# Patient Record
Sex: Male | Born: 1960
Health system: Southern US, Community
[De-identification: ages and names within clinical notes are randomized; demographics above are authoritative.]

## PROBLEM LIST (undated history)

## (undated) DIAGNOSIS — I493 Ventricular premature depolarization: Secondary | ICD-10-CM

## (undated) DIAGNOSIS — C4491 Basal cell carcinoma of skin, unspecified: Secondary | ICD-10-CM

## (undated) DIAGNOSIS — K311 Adult hypertrophic pyloric stenosis: Secondary | ICD-10-CM

## (undated) DIAGNOSIS — K219 Gastro-esophageal reflux disease without esophagitis: Secondary | ICD-10-CM

## (undated) DIAGNOSIS — I480 Paroxysmal atrial fibrillation: Secondary | ICD-10-CM

## (undated) DIAGNOSIS — I4891 Unspecified atrial fibrillation: Secondary | ICD-10-CM

## (undated) DIAGNOSIS — G43109 Migraine with aura, not intractable, without status migrainosus: Secondary | ICD-10-CM

## (undated) DIAGNOSIS — E785 Hyperlipidemia, unspecified: Secondary | ICD-10-CM

## (undated) DIAGNOSIS — E669 Obesity, unspecified: Secondary | ICD-10-CM

## (undated) DIAGNOSIS — F419 Anxiety disorder, unspecified: Secondary | ICD-10-CM

## (undated) HISTORY — DX: Adult hypertrophic pyloric stenosis: K31.1

## (undated) HISTORY — DX: Gastro-esophageal reflux disease without esophagitis: K21.9

## (undated) HISTORY — DX: Anxiety disorder, unspecified: F41.9

## (undated) HISTORY — DX: Ventricular premature depolarization: I49.3

## (undated) HISTORY — DX: Obesity, unspecified: E66.9

## (undated) HISTORY — DX: Paroxysmal atrial fibrillation: I48.0

## (undated) HISTORY — DX: Migraine with aura, not intractable, without status migrainosus: G43.109

## (undated) HISTORY — DX: Basal cell carcinoma of skin, unspecified: C44.91

## (undated) HISTORY — PX: HERNIA REPAIR: SHX51

## (undated) HISTORY — DX: Hyperlipidemia, unspecified: E78.5

## (undated) HISTORY — DX: Unspecified atrial fibrillation: I48.91

## (undated) HISTORY — PX: OTHER SURGICAL HISTORY: SHX169

---

## 1998-02-22 DIAGNOSIS — I4891 Unspecified atrial fibrillation: Secondary | ICD-10-CM

## 1998-02-22 HISTORY — DX: Unspecified atrial fibrillation: I48.91

## 2003-06-23 ENCOUNTER — Observation Stay (HOSPITAL_COMMUNITY): Admission: EM | Admit: 2003-06-23 | Discharge: 2003-06-24 | Payer: Self-pay

## 2005-07-10 IMAGING — CR DG CHEST 1V PORT
1 series · 1 of 1 positions shown · non-contrast
Comparison: none

CLINICAL DATA: Irregular heart rate.
 PORTABLE SINGLE-VIEW CHEST, 06/23/03, [DATE] HOURS
 No prior studies for comparison.
 No evidence of edema, infiltrate, or pleural effusion.  Heart size upper limits normal.  There likely is a hiatal hernia.  Bony thorax is unremarkable.
 IMPRESSION 
 No active disease.  Mild prominence of heart size and probable underlying hiatal hernia.

[view not recorded]
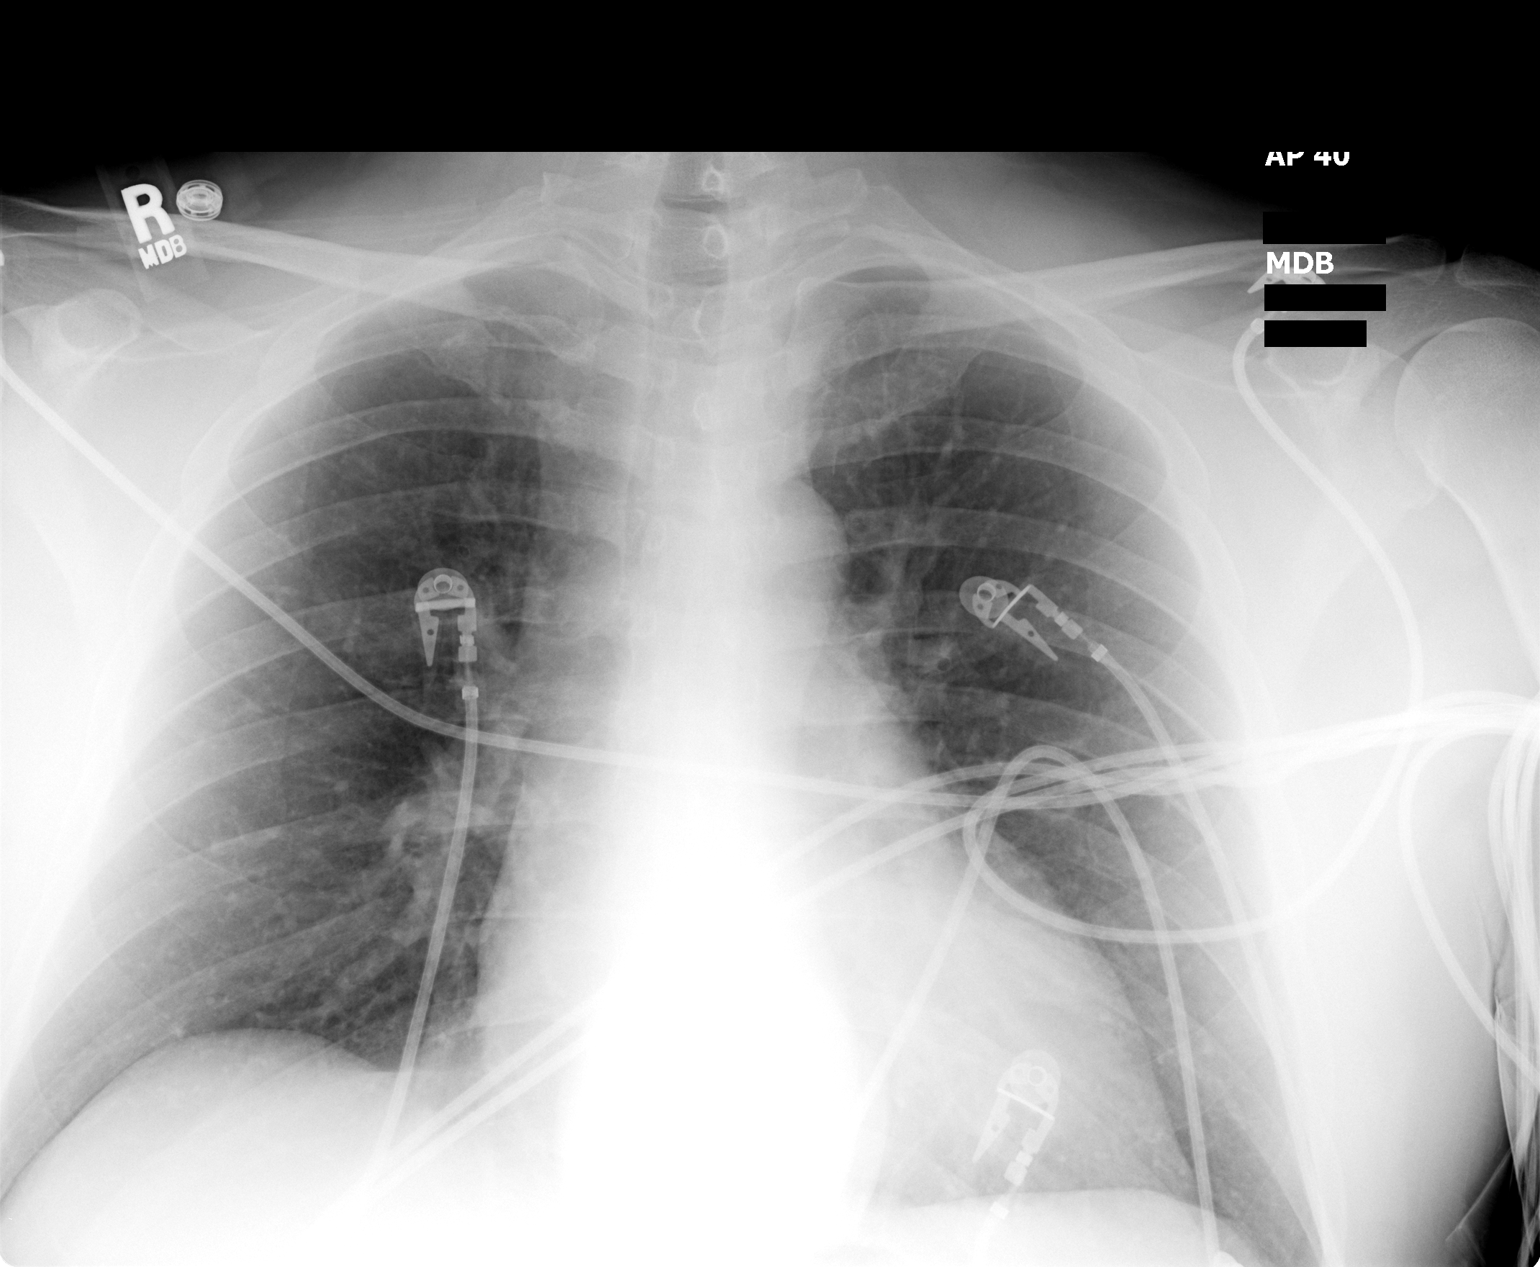

[1 of 1 positions shown; findings below may reference images not displayed]

## 2009-03-10 ENCOUNTER — Emergency Department (HOSPITAL_COMMUNITY): Admission: EM | Admit: 2009-03-10 | Discharge: 2009-03-10 | Payer: Self-pay | Admitting: Emergency Medicine

## 2010-07-10 NOTE — Discharge Summary (Signed)
Eddie Harris, Eddie Harris                           ACCOUNT NO.:  [id number]   MEDICAL RECORD NO.:  [id number]                   PATIENT TYPE:  INP   LOCATION:  3738                                 FACILITY:  [facility]   PHYSICIAN:  Colleen Can. Deborah Chalk, M.D.            DATE OF BIRTH:  [birth year]   DATE OF ADMISSION:  06/23/2003  DATE OF DISCHARGE:                                 DISCHARGE SUMMARY   PRIMARY DISCHARGE DIAGNOSIS:  New onset atrial fibrillation with subsequent  resolution and conversion back to normal sinus rhythm.   HISTORY OF PRESENT ILLNESS:  Mr. Leffel is a very pleasant, 50 year old white  male who basically has known cardiac history.  He has had isolated PVCs in  the past, but overall has done well from a cardiac standpoint.  He presents  to the emergency room department on the morning of 06/23/2003 for complaints  of palpitations.  He had been to a party on the evening before had had  significant alcohol use as well as had overeaten.  When he awoke on the  morning of admission he got nauseated.  He subsequently vomited x1 and then  he began to notice increase in his heart rate.  He really had no complaints  of chest pain, no real lightheadedness or dizziness and no frank syncope.  When he presented to the emergency room he was noted to have atrial  fibrillation with a rapid ventricular response.  Heart rate was  approximately 150. He was subsequently admitted for further evaluation.   Please see dictated history and physical for the patient presentation and  profile.   LABORATORY DATA:  EKG showed atrial fibrillation with rapid ventricular  response; there were nonspecific changes.   Cardiac enzymes were negative. TSH was normal. CBC showed a white count of  12.9, hematocrit 45, hemoglobin 16.  Potassium was 3.6.   Chest x-ray showed a likely hiatal hernia with no active disease.  There was  mild prominence of the heart size.  It was a portable film.   HOSPITAL COURSE:   The patient was admitted. He was placed on IV Cardizem as  well as oral dose or Lopressor and began on subcutaneous Lovenox.  He  subsequently converted, spontaneously, to normal sinus rhythm.   On 06/24/2003 he is doing well without complaints.  His enzymes are all  negative for cardiac injury and he is felt to be a stable candidate for  discharge with further evaluation to occur on outpatient basis.   DISCHARGE CONDITION:  Stable.   DISCHARGE MEDICATIONS:  1. Aspirin daily.  2. Toprol XL 50 mg daily.   DISCHARGE INSTRUCTIONS:  1. Activity is to be progressive.  2. Diet is heart healthy with limited alcohol use.  3. Our office will be arranging follow up time for a 2D echocardiogram as     well as stress Cardiolite.  4. He is to call if any  problems arise in the interim.      Sharlee Blew, N.P.                     Colleen Can. Deborah Chalk, M.D.    LC/MEDQ  D:  06/24/2003  T:  06/24/2003  Job:  [id number]   cc:   Meredith Staggers, M.D.  [address]  [place]  [place] [address]  Fax: [phone]

## 2010-07-10 NOTE — H&P (Signed)
NAMEANTONE, SUMMONS                           ACCOUNT NO.:  [id number]   MEDICAL RECORD NO.:  [id number]                   PATIENT TYPE:  EMS   LOCATION:  MAJO                                 FACILITY:  [facility]   PHYSICIAN:  Colleen Can. Deborah Chalk, M.D.            DATE OF BIRTH:  [birth year]   DATE OF ADMISSION:  06/23/2003  DATE OF DISCHARGE:                                HISTORY & PHYSICAL   CHIEF COMPLAINT:  I feel like my heart is humming.   HISTORY OF PRESENT ILLNESS:  Mr. Krider is a very-pleasant 50 year old male  who presented to the emergency department this morning with atrial  fibrillation with a rapid ventricular response.  He basically had been in  his usual state of health.  However, he attended a birthday party last  evening.  He did have significant alcohol use and over-ate.  This morning,  when he awoke, he felt nauseated.  He subsequently vomited x1 and then he  noted an increase in his heart rate.  He had no chest pain, no real  lightheadedness or dizziness, and no syncope.  He presented to the  extremities for evaluation and was noted to have atrial fibrillation with a  rapid ventricular response.  He is currently on IV Cardizem and has received  IV Lopressor.   PAST MEDICAL HISTORY:  1. Isolated PVC's.  2. History of pyloric stenosis, repaired at age 67.  3. There are no other reports of hypertension, no other surgeries.   ALLERGIES:  SHELLFISH wish cause anaphylactic reaction.   MEDICATIONS:  None except occasional Flonase.   FAMILY HISTORY:  Unknown.  The patient is adopted.   SOCIAL HISTORY:  He is married.  His wife's name is Misty Stanley.  He has been  married for 14 years.  He has two children.  He does have rare cigar use and  basically he has had just social alcohol use in the past.  He is employed in  [employer].   REVIEW OF SYSTEMS:  As noted above, and is otherwise unremarkable.  He has  had no recent fever or flu.  No abdominal pain, no  constipation, no  diarrhea, no chest pain, no syncope or presyncope.   PHYSICAL EXAMINATION:  GENERAL:  He is currently in no acute distress.  VITAL SIGNS:  Blood pressure 106/76, heart rate is in the 160's slowing to  the 120's.  Respirations 18.  He is afebrile.  SKIN:  Warm and dry.  Color is unremarkable.  LUNGS:  Fairly clear.  HEART:  Shows a tachycardic and irregular rhythm.  ABDOMEN:  Soft, positive bowel sounds, nontender.  EXTREMITIES:  Without edema.  NEUROLOGIC:  Intact.   PERTINENT LABORATORY DATA:  CBC shows white count of 12.9.  Chemistries are  pending.  CK is negative.  EKG shows atrial fibrillation with rapid  ventricular response.  There are nonspecific changes.  Chest x-ray is  pending.   OVERALL IMPRESSION:  Atrial fibrillation with rapid ventricular response,  most likely secondary to alcohol use.   PLAN:  1. He is placed on IV Cardizem.  2. Oral Lopressor.  3. We will check complete labs.  4. We will also place him on subcutaneous Lovenox.      Sharlee Blew, N.P.                     Colleen Can. Deborah Chalk, M.D.    LC/MEDQ  D:  06/23/2003  T:  06/23/2003  Job:  [id number]   cc:   Meredith Staggers, M.D.  [address]  [place]  [place] [address]  Fax: [phone]

## 2010-07-29 ENCOUNTER — Ambulatory Visit (INDEPENDENT_AMBULATORY_CARE_PROVIDER_SITE_OTHER): Payer: BC Managed Care – PPO | Admitting: Family Medicine

## 2010-07-29 ENCOUNTER — Encounter: Payer: Self-pay | Admitting: Family Medicine

## 2010-07-29 VITALS — BP 134/94 | HR 72 | Ht 70.0 in | Wt 229.0 lb

## 2010-07-29 DIAGNOSIS — Z125 Encounter for screening for malignant neoplasm of prostate: Secondary | ICD-10-CM

## 2010-07-29 DIAGNOSIS — F411 Generalized anxiety disorder: Secondary | ICD-10-CM | POA: Insufficient documentation

## 2010-07-29 DIAGNOSIS — E782 Mixed hyperlipidemia: Secondary | ICD-10-CM

## 2010-07-29 MED ORDER — ESCITALOPRAM OXALATE 10 MG PO TABS: 10.0000 mg | ORAL_TABLET | Freq: Every day | ORAL | Status: DC

## 2010-07-29 NOTE — Patient Instructions (Signed)
Limit salt in diet; Exercise daily; Cut back on alcohol to 1-2 drinks daily maximum (less recommended to decrease calories to help with weight loss) Continue Fish oil.  Don't start the red yeast rice until after labs are done Check blood pressure elsewhere, write down and mail/fax/bring to next appointment

## 2010-07-29 NOTE — Progress Notes (Signed)
Subjective:    Patient ID: Eddie Harris, male    DOB: [birth year], 50 y.o.   MRN: [id number]  HPI Patient presents to discuss his Lexapro.  It was started at least 5 years ago after his wife had an affair, and he was having panic attacks.  Denies h/o depression.  He has a lot of work stress, but denies any panic attacks or significant anxiety (just occasional anxiety)  2000 went to a party and drank too much.  The next morning he had atrial fibrillation, and was hospitalized.  He had a negative cardiac workup.  Developed anxiety and panic attacks from that time forward.  He was treated with Toprol.  Has been off the Toprol for many years, and has done well without recurrent symptoms, just occasional PVC's.  Patient has high cholesterol (recalls the LDL and TG being elevated).  TG improved with fish oil, also had used red yeast rice.  He recalls that I had asked for him to return for PSA, hs-CRP and repeat lipids and liver tests (because of the red yeast rice), but he never came back for those.  Last lipid check was over a year ago, per patient.  Past Medical History  Diagnosis Date  . Atrial fibrillation 2000    negative cardiac workup (Dr. Maylon Cos).  Episode occurred after heavy drinking  . Hyperlipidemia   . Anxiety     Past Surgical History  Procedure Date  . Hernia repair age 72 weeks    ?diaphragmatic given age    History   Social History  . Marital Status: Married    Spouse Name: N/A    Number of Children: N/A  . Years of Education: N/A   Occupational History  . 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 Smoking status: Former Smoker    Quit date: 02/23/1987  . Smokeless tobacco: Never Used  . Alcohol Use: Yes     2-3 drinks every other day or so.  . Drug Use: No  . Sexually Active: Not on file   Other Topics Concern  . Not on file   Social History Narrative  . No narrative on file    Family History  Problem Relation Age of Onset  .  Adopted: Yes    Current outpatient prescriptions:arginine 500 MG tablet, Take 500 mg by mouth daily.  , Disp: , Rfl: ;  CALCIUM & MAGNESIUM CARBONATES PO, Take 1 tablet by mouth daily.  , Disp: , Rfl: ;  escitalopram (LEXAPRO) 10 MG tablet, Take 1 tablet (10 mg total) by mouth daily., Disp: 30 tablet, Rfl: 11;  fish oil-omega-3 fatty acids 1000 MG capsule, Take 4 g by mouth daily.  , Disp: , Rfl:  vitamin C (ASCORBIC ACID) 500 MG tablet, Take 500 mg by mouth daily.  , Disp: , Rfl: ;  DISCONTD: escitalopram (LEXAPRO) 10 MG tablet, Take 10 mg by mouth daily.  , Disp: , Rfl:   Allergies  Allergen Reactions  . Shellfish-Derived Products Anaphylaxis and Nausea And Vomiting   Review of Systems Occasional numbness R fingers (1-2), denies weakness.  Denies headaches, allergies, cough, SOB, swelling, joint pain (occasional neck discomfort/stiffness).  Denies reflux, nausea/vomiting, changes in stool (often has a loose stool after his coffee).  No skin rashes, depression, or other concerns    Objective:   Physical Exam  Well developed, well nourished patient, in no distress BP 134/94  Pulse 72  Ht 5\' 10"  (1.778 m)  Wt  229 lb (103.874 kg)  BMI 32.86 kg/m2 Neck: No lymphadenopathy or thyromegaly, no carotid bruit Heart:  Regular rate and rhythm, no murmurs, rubs, gallops or ectopy Lungs:  Clear bilaterally, without wheezes, rales or ronchi Abdomen:  Soft, nontender, nondistended, no hepatosplenomegaly or masses, normal bowel sounds Extremities:  No clubbing, cyanosis or edema, 2+ pulses.  Neuro:  Alert and oriented x 3, cranial nerves grossly intact.  Back:  No spine or CVA tenderness Skin: no rashes or suspicious lesions Psych:  Normal mood, affect, hygiene and grooming, normal speech, eye contact     Assessment & Plan:   1. Anxiety state, unspecified     Elects to continue with Lexapro, but will try tapering off.  If he needs to go back on Lexapro 10mg  (and not come off), then can consider  change to citalopram  2. Mixed hyperlipidemia  Comprehensive metabolic panel, Lipid panel, CRP High sensitivity   lowfat, low cholesterol diet. Continue fish oil.  Cut back on alcohol intake  3. Special screening for malignant neoplasm of prostate  PSA   #1--If he ends up switching to Celexa, and doesn't end up doing as well on Celexa as he did on the Lexapro, but needs to remain on medication (fails taper), then we can Rx 20mg  tablet and have him cut in half to cut costs.  Borderline/elevated blood pressure today.  No history of HTN.  Discussed low sodium diet, regular exercise, weight loss.  Recommended that he periodically check BP's elsewhere.  F/U for fasting lab visit next week.

## 2010-08-06 ENCOUNTER — Encounter: Payer: Self-pay | Admitting: Family Medicine

## 2011-02-24 ENCOUNTER — Encounter: Payer: Self-pay | Admitting: Medical

## 2011-02-24 ENCOUNTER — Ambulatory Visit (INDEPENDENT_AMBULATORY_CARE_PROVIDER_SITE_OTHER): Payer: BC Managed Care – PPO | Admitting: Medical

## 2011-02-24 VITALS — BP 140/80 | HR 80 | Temp 98.2°F | Resp 16 | Wt 234.0 lb

## 2011-02-24 DIAGNOSIS — R030 Elevated blood-pressure reading, without diagnosis of hypertension: Secondary | ICD-10-CM | POA: Insufficient documentation

## 2011-02-24 DIAGNOSIS — W19XXXA Unspecified fall, initial encounter: Secondary | ICD-10-CM

## 2011-02-24 DIAGNOSIS — E663 Overweight: Secondary | ICD-10-CM

## 2011-02-24 DIAGNOSIS — S0093XA Contusion of unspecified part of head, initial encounter: Secondary | ICD-10-CM | POA: Insufficient documentation

## 2011-02-24 DIAGNOSIS — S0003XA Contusion of scalp, initial encounter: Secondary | ICD-10-CM

## 2011-02-24 HISTORY — DX: Elevated blood-pressure reading, without diagnosis of hypertension: R03.0

## 2011-02-24 NOTE — Progress Notes (Signed)
Subjective:   HPI  Eddie Harris is a 51 y.o. male who presents with multiple concerns.  He was last seen here back in the summer with Dr. Lynelle Doctor, was supposed to have return for fasting labs but hasn't.  His main c/o is that he had a few drinks new year's eve, but says he wasn't inebriated.  He ended up walking backwards and tripped over his feet.  He landed on his butt bone and hit the back of his head.  He denies LOC, headache, confusion, numbness, tingling, weakness, dizziness, no bleeding.  He still notes tenderness at the area of his head that hit, but wants to get checked out in the event of other concerns. He notes only having 3-4 drinks that night.  He is here for recheck on elevated BP.  Had elevated BP in the summer.  However, he has just started the modified [employer] and plans to lose weight through diet and exercise changes.  He is tired of his current weight.   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  No other aggravating or relieving factors.    No other c/o.  The following portions of the patient's history were reviewed and updated as appropriate: allergies, current medications, past family history, past medical history, past social history, past surgical history and problem list.  Past Medical History  Diagnosis Date  . Campath-induced atrial fibrillation 2000    negative cardiac workup (Dr. Maylon Cos).  Episode occurred after heavy drinking  . Hyperlipidemia   . Anxiety   . GERD (gastroesophageal reflux disease)     silent--EGD Dr. Kinnie Scales  . Ocular migraine     Dr. Salvatore Marvel  . Skin cancer, basal cell     Dr. Doristine Section    Allergies  Allergen Reactions  . Shellfish-Derived Products Anaphylaxis and Nausea And Vomiting    Current Outpatient Prescriptions on File Prior to Visit  Medication Sig Dispense Refill  . arginine 500 MG tablet Take 500 mg by mouth daily.        Marland Kitchen CALCIUM & MAGNESIUM CARBONATES PO Take 1 tablet by  mouth daily.        . fish oil-omega-3 fatty acids 1000 MG capsule Take 4 g by mouth daily.        . vitamin C (ASCORBIC ACID) 500 MG tablet Take 500 mg by mouth daily.           Review of Systems Constitutional: denies fever, chills, sweats, unexpected weight change, fatigue ENT: no runny nose, ear pain, sore throat Cardiology: denies chest pain, palpitations, edema Respiratory: denies cough, shortness of breath, wheezing Gastroenterology: denies abdominal pain, nausea, vomiting, diarrhea, constipation  Hematology: denies bleeding or bruising problems Musculoskeletal: denies arthralgias, myalgias, joint swelling, back pain Ophthalmology: denies vision changes Urology: denies dysuria, difficulty urinating, hematuria, urinary frequency, urgency Neurology: no headache, weakness, tingling, numbness      Objective:   Physical Exam  General appearance: alert, no distress, WD/WN, white male, overweight HEENT: normocephalic, posterior scalp with tender slight raised area consistent with hematoma, otherwise sclerae anicteric, TMs pearly, canals normal, nares patent, no discharge or erythema, pharynx normal, no battle signs Oral cavity: MMM, no lesions Neck: supple, no lymphadenopathy, no thyromegaly, no masses, no bruits Heart: RRR, normal S1, S2, no murmurs Lungs: CTA bilaterally, no wheezes, rhonchi, or rales Abdomen: +bs, soft, non tender, non distended, no masses, no hepatomegaly, no splenomegaly Pulses: 2+ symmetric, upper and lower extremities, normal cap  refill Neuro: CN 2-12 intact, A&O x 3, non focal exam  Assessment and Plan :     Encounter Diagnoses  Name Primary?  . Head contusion Yes  . Fall   . Elevated blood pressure reading without diagnosis of hypertension   . Overweight    Head contusion-reassured that current exam suggest a contusion but no concussion or intracranial bleed.  If symptoms worsen with signs of acute neurological changes, consider CT head urgently.  However, no real indication for that today.  Fall-advise he avoid falls, discussed fall prevention, discussed alcohol use  Elevated Blood pressure-he is working on lifestyle changes, but he will return for fasting labs as discussed with Dr. Lynelle Doctor in the summer.  Pending labs may need recheck office visit to discuss labs and plan which may include beginning medication for blood pressure.  Labs: CMET, Lipid, PSA, CRP.  Obesity -  discussed need to work on diet and exercise changes for weight loss

## 2011-02-24 NOTE — Patient Instructions (Signed)
Follow up for fasting labs.

## 2011-05-03 ENCOUNTER — Encounter: Payer: Self-pay | Admitting: *Deleted

## 2011-05-14 ENCOUNTER — Other Ambulatory Visit (INDEPENDENT_AMBULATORY_CARE_PROVIDER_SITE_OTHER): Payer: BC Managed Care – PPO

## 2011-05-14 DIAGNOSIS — Z111 Encounter for screening for respiratory tuberculosis: Secondary | ICD-10-CM

## 2011-05-14 DIAGNOSIS — Z23 Encounter for immunization: Secondary | ICD-10-CM

## 2011-05-14 MED ORDER — TUBERCULIN PPD 5 UNIT/0.1ML ID SOLN
5.0000 [IU] | Freq: Once | INTRADERMAL | Status: AC
  Administered 2011-05-14: 5 [IU] via INTRADERMAL

## 2011-08-05 ENCOUNTER — Other Ambulatory Visit: Payer: Self-pay | Admitting: *Deleted

## 2011-08-05 DIAGNOSIS — Z125 Encounter for screening for malignant neoplasm of prostate: Secondary | ICD-10-CM

## 2011-08-05 DIAGNOSIS — E782 Mixed hyperlipidemia: Secondary | ICD-10-CM

## 2011-08-09 ENCOUNTER — Encounter: Payer: Self-pay | Admitting: Internal Medicine

## 2011-08-13 ENCOUNTER — Other Ambulatory Visit: Payer: BC Managed Care – PPO

## 2011-08-20 ENCOUNTER — Other Ambulatory Visit: Payer: BC Managed Care – PPO

## 2011-08-20 DIAGNOSIS — Z125 Encounter for screening for malignant neoplasm of prostate: Secondary | ICD-10-CM

## 2011-08-20 DIAGNOSIS — E782 Mixed hyperlipidemia: Secondary | ICD-10-CM

## 2011-08-20 LAB — COMPREHENSIVE METABOLIC PANEL
ALT: 25 U/L (ref 0–53)
AST: 24 U/L (ref 0–37)
Albumin: 4.6 g/dL (ref 3.5–5.2)
BUN: 19 mg/dL (ref 6–23)
CO2: 25 mEq/L (ref 19–32)
Chloride: 106 mEq/L (ref 96–112)
Creat: 1.13 mg/dL (ref 0.50–1.35)
Glucose, Bld: 104 mg/dL — ABNORMAL HIGH (ref 70–99)
Sodium: 141 mEq/L (ref 135–145)

## 2011-08-20 LAB — LIPID PANEL
HDL: 51 mg/dL (ref >39)
LDL Cholesterol: 148 mg/dL — ABNORMAL HIGH (ref 0–99)
Total CHOL/HDL Ratio: 4.4 Ratio

## 2011-08-20 LAB — HIGH SENSITIVITY CRP: CRP, High Sensitivity: 1 mg/L

## 2011-08-23 ENCOUNTER — Telehealth: Payer: Self-pay | Admitting: Family Medicine

## 2011-08-23 NOTE — Telephone Encounter (Signed)
Spoke with patient and went over labs. He scheduled CPE for 10/27/11 @ 10:15am.

## 2011-08-23 NOTE — Telephone Encounter (Signed)
Patient called and wanted labs results from 08/20/11.

## 2011-08-23 NOTE — Telephone Encounter (Signed)
Cholesterol and sugar were slightly high.  These labs were ordered a year ago.  Due for another CPE.  Can discuss labs in more detail at his visit. In the interim, low cholesterol diet, avoid sugars/sweets (sodas, sweet tea, alcohol, carbs can all contribute to sugars).

## 2011-09-14 ENCOUNTER — Telehealth: Payer: Self-pay | Admitting: Family Medicine

## 2011-09-14 NOTE — Telephone Encounter (Signed)
Veronica--please call and see what his question is.  Office visits (as opposed to PE's) are specifically for this reason.  If he wants to restart, and needs to know how, start at 1/2 tablet for a week, then increase to full tablet.  His refills likely are expired, as rx was written over a year ago

## 2011-09-14 NOTE — Telephone Encounter (Signed)
Patient hasn't been seen by me in over a year.  Please have him schedule an appt.  Looks he has a CPE scheduled for September, but can schedule an OV sooner if having a specific issue (ie f/u anxiety, or whatever his concern is).

## 2011-09-15 ENCOUNTER — Telehealth: Payer: Self-pay | Admitting: *Deleted

## 2011-09-15 ENCOUNTER — Telehealth: Payer: Self-pay | Admitting: Family Medicine

## 2011-09-15 NOTE — Telephone Encounter (Signed)
Left message for patient to return my call.

## 2011-09-15 NOTE — Telephone Encounter (Signed)
Eddie Harris called not happy, said he was not feeling the love and he does not understand why he can't talk with you re alternative medications.  I explained to pt if he wanted to know about alternative meds that you would want to educate him about those and he would need to make an appt.  He is coming in tomorrow.  FYI

## 2011-09-15 NOTE — Telephone Encounter (Signed)
noted 

## 2011-09-16 ENCOUNTER — Institutional Professional Consult (permissible substitution): Payer: BC Managed Care – PPO | Admitting: Family Medicine

## 2011-10-10 ENCOUNTER — Other Ambulatory Visit: Payer: Self-pay | Admitting: Family Medicine

## 2011-10-11 ENCOUNTER — Encounter: Payer: Self-pay | Admitting: Family Medicine

## 2011-10-11 ENCOUNTER — Ambulatory Visit (INDEPENDENT_AMBULATORY_CARE_PROVIDER_SITE_OTHER): Payer: BC Managed Care – PPO | Admitting: Family Medicine

## 2011-10-11 VITALS — BP 130/98 | HR 88 | Ht 70.0 in | Wt 221.0 lb

## 2011-10-11 DIAGNOSIS — F411 Generalized anxiety disorder: Secondary | ICD-10-CM

## 2011-10-11 DIAGNOSIS — R030 Elevated blood-pressure reading, without diagnosis of hypertension: Secondary | ICD-10-CM

## 2011-10-11 DIAGNOSIS — E663 Overweight: Secondary | ICD-10-CM

## 2011-10-11 DIAGNOSIS — E782 Mixed hyperlipidemia: Secondary | ICD-10-CM

## 2011-10-11 MED ORDER — ESCITALOPRAM OXALATE 10 MG PO TABS: 10.0000 mg | ORAL_TABLET | Freq: Every day | ORAL | Status: DC

## 2011-10-11 NOTE — Patient Instructions (Addendum)
Start lexapro at 1/2 tablet daily.  Increase after 4-6 days if not having side effects. Check blood pressure twice a week, and write down.  Bring list to your physical.   Low sodium diet, daily exercise.  Consider re-starting counseling to help with the anxiety.   2 Gram Low Sodium Diet A 2 gram sodium diet restricts the amount of sodium in the diet to no more than 2 g or 2000 mg daily. Limiting the amount of sodium is often used to help lower blood pressure. It is important if you have heart, liver, or kidney problems. Many foods contain sodium for flavor and sometimes as a preservative. When the amount of sodium in a diet needs to be low, it is important to know what to look for when choosing foods and drinks. The following includes some information and guidelines to help make it easier for you to adapt to a low sodium diet. QUICK TIPS  Do not add salt to food.   Avoid convenience items and fast food.   Choose unsalted snack foods.   Buy lower sodium products, often labeled as "lower sodium" or "no salt added."   Check food labels to learn how much sodium is in 1 serving.   When eating at a restaurant, ask that your food be prepared with less salt or none, if possible.  READING FOOD LABELS FOR SODIUM INFORMATION The nutrition facts label is a good place to find how much sodium is in foods. Look for products with no more than 500 to 600 mg of sodium per meal and no more than 150 mg per serving. Remember that 2 g = 2000 mg. The food label may also list foods as:  Sodium-free: Less than 5 mg in a serving.   Very low sodium: 35 mg or less in a serving.   Low-sodium: 140 mg or less in a serving.   Light in sodium: 50% less sodium in a serving. For example, if a food that usually has 300 mg of sodium is changed to become light in sodium, it will have 150 mg of sodium.   Reduced sodium: 25% less sodium in a serving. For example, if a food that usually has 400 mg of sodium is changed to  reduced sodium, it will have 300 mg of sodium.  CHOOSING FOODS Grains  Avoid: Salted crackers and snack items. Some cereals, including instant hot cereals. Bread stuffing and biscuit mixes. Seasoned rice or pasta mixes.   Choose: Unsalted snack items. Low-sodium cereals, oats, puffed wheat and rice, shredded wheat. English muffins and bread. Pasta.  Meats  Avoid: Salted, canned, smoked, spiced, pickled meats, including fish and poultry. Bacon, ham, sausage, cold cuts, hot dogs, anchovies.   Choose: Low-sodium canned tuna and salmon. Fresh or frozen meat, poultry, and fish.  Dairy  Avoid: Processed cheese and spreads. Cottage cheese. Buttermilk and condensed milk. Regular cheese.   Choose: Milk. Low-sodium cottage cheese. Yogurt. Sour cream. Low-sodium cheese.  Fruits and Vegetables  Avoid: Regular canned vegetables. Regular canned tomato sauce and paste. Frozen vegetables in sauces. Olives. Rosita Fire. Relishes. Sauerkraut.   Choose: Low-sodium canned vegetables. Low-sodium tomato sauce and paste. Frozen or fresh vegetables. Fresh and frozen fruit.  Condiments  Avoid: Canned and packaged gravies. Worcestershire sauce. Tartar sauce. Barbecue sauce. Soy sauce. Steak sauce. Ketchup. Onion, garlic, and table salt. Meat flavorings and tenderizers.   Choose: Fresh and dried herbs and spices. Low-sodium varieties of mustard and ketchup. Lemon juice. Tabasco sauce. Horseradish.  SAMPLE 2  GRAM SODIUM MEAL PLAN Breakfast / Sodium (mg)  1 cup low-fat milk / 143 mg   2 slices whole-wheat toast / 270 mg   1 tbs heart-healthy margarine / 153 mg   1 hard-boiled egg / 139 mg   1 small orange / 0 mg  Lunch / Sodium (mg)  1 cup raw carrots / 76 mg    cup hummus / 298 mg   1 cup low-fat milk / 143 mg    cup red grapes / 2 mg   1 whole-wheat pita bread / 356 mg  Dinner / Sodium (mg)  1 cup whole-wheat pasta / 2 mg   1 cup low-sodium tomato sauce / 73 mg   3 oz lean ground beef / 57  mg   1 small side salad (1 cup raw spinach leaves,  cup cucumber,  cup yellow bell pepper) with 1 tsp olive oil and 1 tsp red wine vinegar / 25 mg  Snack / Sodium (mg)  1 container low-fat vanilla yogurt / 107 mg   3 graham cracker squares / 127 mg  Nutrient Analysis  Calories: 2033   Protein: 77 g   Carbohydrate: 282 g   Fat: 72 g   Sodium: 1971 mg  Document Released: 02/08/2005 Document Revised: 01/28/2011 Document Reviewed: 05/12/2009 [facility] Patient Information 2012 [place], [place].

## 2011-10-11 NOTE — Progress Notes (Signed)
Chief Complaint  Patient presents with  . Advice Only    was taking Lexapro and stopped cold [place] about 8 months ago. Wants to discuss restatrting medication vs restarting another option to Lexapro.    HPI: He stopped the Lexapro cold [place] about 6-8 months ago.  He had finished the bottle, and decided not to take it anymore. He was doing okay until the last month when he started feeling unfocused, some anxiety.  Felt anxious on a fishing trip with family.  Not enjoying things (like vacation).  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  Feels unfocused.  Not sleeping well.  Usually falls asleep okay, but wakens at 3 am unable to go back to sleep right away.  Was on vacation x 2 weeks, and drank a lot while away.  More anxious the day following heavier drinking.  Prior to vacation, was having 2-3 drinks after work, doing that moreso since being off the [employer].  Exercises almost daily.  Lost weight--was 210 prior to beach, walked 1 hr daily--weight up here, with clothes, after eating, etc.   Last night felt very anxious after a 3 mile walk.  BP 140/105.  This made him more anxious.  Checked his BP off/on until midnight, and reports that it remained elevated.  This morning, after 1 hour bike ride BP was 120/88.  He also has questions regarding his recent lipid results (which were ordered >1 year ago, but only recently done).  Past Medical History  Diagnosis Date  . Atrial fibrillation 2000    negative cardiac workup (Dr. Maylon Cos).  Episode occurred after heavy drinking  . Hyperlipidemia   . Anxiety   . GERD (gastroesophageal reflux disease)     silent--EGD Dr. Kinnie Scales  . Ocular migraine     Dr. Salvatore Marvel  . Skin cancer, basal cell     Dr. Doristine Section  . Paroxysmal atrial fibrillation   . Mild obesity   . Pyloric stenosis   . PVC (premature ventricular contraction)    Past Surgical  History  Procedure Date  . Hernia repair age 69 weeks    ?diaphragmatic given age   History   Social History  . Marital Status: Married    Spouse Name: N/A    Number of Children: 2  . Years of Education: N/A   Occupational History  . 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 Smoking status: Former Smoker    Quit date: 02/23/1987  . Smokeless tobacco: Never Used  . Alcohol Use: Yes     2-3 drinks every other day or so.  . Drug Use: No  . Sexually Active: Not on file   Other Topics Concern  . Not on file   Social History Narrative  . No narrative on file   Current Outpatient Prescriptions on File Prior to Visit  Medication Sig Dispense Refill  . CALCIUM & MAGNESIUM CARBONATES PO Take 1 tablet by mouth daily.        . fish oil-omega-3 fatty acids 1000 MG capsule Take 4 g by mouth daily.        . vitamin C (ASCORBIC ACID) 500 MG tablet Take 500 mg by mouth daily.        Marland Kitchen escitalopram (LEXAPRO) 10 MG tablet Take 1 tablet (10 mg total) by mouth daily.  30 tablet  11  (NOT taking  Lexapro currently)  Allergies  Allergen Reactions  . Shellfish-Derived Products Anaphylaxis and Nausea And Vomiting   ROS:  Denies headaches, dizziness, chest pain, palpitations, shortness of breath, fevers, URI symptoms, bleeding/bruising, neurologic symptoms, complaints of pain or other concerns except as per HPI.  PHYSICAL EXAM: BP 130/98  Pulse 88  Ht 5\' 10"  (1.778 m)  Wt 221 lb (100.245 kg)  BMI 31.71 kg/m2 Pleasant, talkative male, in no distress.  Asking many questions, mildly anxious.  Normal eye contact, speech, hygiene and grooming Neck: no lymphadenopathy, thyromegaly or mass Heart: regular rate and rhythm without murmur Lungs: clear bilaterally Abdomen: obese, soft, nontender no mass Extremities: no edema Skin: no rash Neuro: alert and oriented. Normal gait, strength, grossly intact cranial nerves  Lab Results  Component Value Date   CHOL 224*  08/20/2011   HDL 51 08/20/2011   LDLCALC 148* 08/20/2011   TRIG 127 08/20/2011   CHOLHDL 4.4 08/20/2011   Hs-CRP = 1.0  ASSESSMENT/PLAN: 1. Anxiety state, unspecified  escitalopram (LEXAPRO) 10 MG tablet  2. Elevated blood pressure reading without diagnosis of hypertension    3. Mixed hyperlipidemia    4. Overweight     Discussed restarting lexapro at 1/2 tab for 4-6 days then increase to full tablet.  Discussed that it is now generic, and may be less expensive (previously cost him $100/month).  Given that he has HSA, still may be very expensive.  If this is the case, then can change over to citalopram 20mg . Discussed that lexapro might work just a touch faster, but overall would likely be as effective.  Side effects and risks reviewed.  Also encouraged counseling (as he had concerns about taking medications longterm--discussed what counseling could provide, especially in providing some techniques that he could use while waiting for meds to become effective).  Discussed that in the future (ie at least 6-12 months, if at all), if he wants to see if he can get off the medication, that he should take 1/2 tablet for at least 1-3 months to see if he does well on lower dose, prior to stopping it completely.  Discussed potential risks of sudden discontinuation.  Elevated BP--likely related to anxiety.  Recommend that he follow low sodium diet, continue daily exercise, and continue to lose weight.  He should monitor BP but not as frequently, as it appears to be contributing to his anxiety.  Bring list of BP's checked to his CPE in a few weeks.  Hyperlipidemia.  Reviewed low cholesterol diet, goal of LDL<130.  Plan to recheck in 3-6 months--will be too soon to check at his scheduled CPE  F/u as scheduled for CPE in September. >25 minute office visit, more than 1/2 spent in counseling as above.

## 2011-10-11 NOTE — Telephone Encounter (Signed)
Is this okay to fill? 

## 2011-10-27 ENCOUNTER — Encounter: Payer: BC Managed Care – PPO | Admitting: Family Medicine

## 2012-02-07 ENCOUNTER — Other Ambulatory Visit (INDEPENDENT_AMBULATORY_CARE_PROVIDER_SITE_OTHER): Payer: BC Managed Care – PPO

## 2012-02-07 DIAGNOSIS — Z23 Encounter for immunization: Secondary | ICD-10-CM

## 2012-02-07 DIAGNOSIS — Z111 Encounter for screening for respiratory tuberculosis: Secondary | ICD-10-CM

## 2012-02-07 NOTE — Addendum Note (Signed)
Addended by: Janeice Robinson on: 02/07/2012 04:13 PM   Modules accepted: Orders

## 2012-05-09 ENCOUNTER — Other Ambulatory Visit: Payer: BC Managed Care – PPO

## 2012-05-09 DIAGNOSIS — Z23 Encounter for immunization: Secondary | ICD-10-CM

## 2012-05-11 LAB — TB SKIN TEST

## 2012-05-18 ENCOUNTER — Encounter: Payer: Self-pay | Admitting: Cardiology

## 2012-05-22 ENCOUNTER — Encounter: Payer: Self-pay | Admitting: Cardiology

## 2012-10-26 ENCOUNTER — Other Ambulatory Visit: Payer: Self-pay | Admitting: Family Medicine

## 2012-10-26 ENCOUNTER — Other Ambulatory Visit: Payer: Self-pay | Admitting: *Deleted

## 2012-10-26 DIAGNOSIS — F411 Generalized anxiety disorder: Secondary | ICD-10-CM

## 2012-10-26 MED ORDER — ESCITALOPRAM OXALATE 10 MG PO TABS: 10.0000 mg | ORAL_TABLET | Freq: Every day | ORAL | Status: DC

## 2012-10-26 NOTE — Telephone Encounter (Signed)
Spoke with patient and let him know that I am only able to call in 30 days worth of Lexapro, he needs med check in the next 30 days. He could not make an appointment while I had him on the phone, stated that he would need to call back.

## 2012-10-26 NOTE — Telephone Encounter (Signed)
He hasn't been seen in over a year, no showed his CPE last year.  He can have #30, with NO refills, and needs to schedule med check (and/or CPE, but will NOT refill until next available CPE, will need a med check within the month, or a CPE, if available in that time frame)

## 2012-10-26 NOTE — Telephone Encounter (Signed)
Is this okay to refill? 

## 2012-10-27 ENCOUNTER — Other Ambulatory Visit: Payer: Self-pay | Admitting: Family Medicine

## 2012-10-27 NOTE — Telephone Encounter (Signed)
Is this okay to refill? 

## 2012-10-27 NOTE — Telephone Encounter (Signed)
Deny--this was refilled yesterday (with notification that he needs visit for further refills)

## 2013-01-06 ENCOUNTER — Other Ambulatory Visit: Payer: Self-pay | Admitting: Family Medicine

## 2013-01-08 ENCOUNTER — Ambulatory Visit (INDEPENDENT_AMBULATORY_CARE_PROVIDER_SITE_OTHER): Payer: BC Managed Care – PPO | Admitting: Family Medicine

## 2013-01-08 ENCOUNTER — Encounter: Payer: Self-pay | Admitting: Family Medicine

## 2013-01-08 VITALS — BP 140/100 | HR 72 | Ht 70.0 in | Wt 232.0 lb

## 2013-01-08 DIAGNOSIS — R030 Elevated blood-pressure reading, without diagnosis of hypertension: Secondary | ICD-10-CM

## 2013-01-08 DIAGNOSIS — F411 Generalized anxiety disorder: Secondary | ICD-10-CM

## 2013-01-08 DIAGNOSIS — E669 Obesity, unspecified: Secondary | ICD-10-CM

## 2013-01-08 DIAGNOSIS — R5381 Other malaise: Secondary | ICD-10-CM

## 2013-01-08 DIAGNOSIS — Z125 Encounter for screening for malignant neoplasm of prostate: Secondary | ICD-10-CM

## 2013-01-08 MED ORDER — ESCITALOPRAM OXALATE 10 MG PO TABS: 10.0000 mg | ORAL_TABLET | Freq: Every day | ORAL | Status: DC

## 2013-01-08 NOTE — Progress Notes (Signed)
Chief Complaint  Patient presents with  . Depression    mec check fo Lexapro. Patient is not fasting.   Patient presents for follow up on anxiety.  He hasn't been seen in this office for over a year, and no-showed his physical a year ago.  Anxiety--he has only been taking 5 mg of Lexapro daily which had been effective until increased stressors recently.  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  He is now taking legal action against him.  He admits to drinking more, and having early morning awakening, can't get back to sleep.  Finances are struggling due to this. He had to lay off someone that worked for him, closing his office and moving office back into his home.  Elevated BP's.  He has been checking BP's with a home monitor periodically.  BP's at home are running 130's-140's/85-95.  134/89 this morning. He doesn't add salt to foods, doesn't eat much processed food.  He was on toprol at one point many years ago (after he had atrial fibrillation after excessive alcohol intake.) He recalls taking it for a year and doesn't recall having any side effects.  Now only has occasional palpitation/PVC. Exercising 2-3x/week (mini-trampoline, elliptical, rides bike, weights).  Past Medical History  Diagnosis Date  . Atrial fibrillation 2000    negative cardiac workup (Dr. Maylon Cos).  Episode occurred after heavy drinking  . Hyperlipidemia   . Anxiety   . GERD (gastroesophageal reflux disease)     silent--EGD Dr. Kinnie Scales  . Ocular migraine     Dr. Salvatore Marvel  . Skin cancer, basal cell     Dr. Doristine Section  . Paroxysmal atrial fibrillation   . Mild obesity   . Pyloric stenosis   . PVC (premature ventricular contraction)    Past Surgical History  Procedure Laterality Date  . Hernia repair  age 30 weeks    ?diaphragmatic given age  . Basal cell skin cancer excision     History   Social History  . Marital Status:  Married    Spouse Name: N/A    Number of Children: 2  . Years of Education: N/A   Occupational History  . sells medical instruments (medical supplies)    Social History Main Topics  . Smoking status: Former Smoker    Quit date: 02/23/1987  . Smokeless tobacco: Never Used  . Alcohol Use: Yes     Comment: 3-4 drinks every night  . Drug Use: No  . Sexual Activity: Not on file   Other Topics Concern  . Not on file   Social History Narrative   Married, 2 children.  Moving office back into his house (due to financial strains)   Current outpatient prescriptions:ALPHA LIPOIC ACID PO, Take 1-2 tablets by mouth daily., Disp: , Rfl: ;  Arginine POWD, 1-2 scoop by Does not apply route daily., Disp: , Rfl: ;  CALCIUM & MAGNESIUM CARBONATES PO, Take 1 tablet by mouth daily.  , Disp: , Rfl: ;  Coenzyme Q10 10 MG capsule, Take 10 mg by mouth daily., Disp: , Rfl: ;  escitalopram (LEXAPRO) 10 MG tablet, Take 1 tablet (10 mg total) by mouth daily., Disp: 30 tablet, Rfl: 3 fish oil-omega-3 fatty acids 1000 MG capsule, Take 2 g by mouth daily. , Disp: , Rfl: ;  vitamin C (ASCORBIC ACID) 500 MG tablet, Take 500 mg by mouth daily.  , Disp: , Rfl:  Allergies  Allergen Reactions  . Shellfish-Derived Products Anaphylaxis and Nausea And Vomiting    ROS:  Denies headaches, dizziness, fevers ,URI symptoms, chest pain, shortness of breath, only rare palpitation/PVC.  Denies nausea, vomiting, heartburn, bowel changes, joint pains, rashes, bleeding/bruising.  Denies depression, +stress/anxiety/insomnia.  See HPI  PHYSICAL EXAM: BP 140/100  Pulse 72  Ht 5\' 10"  (1.778 m)  Wt 232 lb (105.235 kg)  BMI 33.29 kg/m2 140/106 on repeat by MD, right arm Pleasant, talkative male in no distress.  Has a lot of questions. HEENT:  PERRL, EOMI, conjunctiva clear Neck: no lymphadenopathy, thyromegaly or carotid bruit Heart: regular rate and rhythm without murmur Lungs: clear bilaterally Extremties: no edema Psych:  mildly anxious, full range of affect.  Normal hygiene/grooming (wearing scrubs) Neuro: alert and oriented. Normal gait, strength, cranial nerves grossly intact.  ASSESSMENT/PLAN:  Anxiety state, unspecified - Plan: escitalopram (LEXAPRO) 10 MG tablet  Elevated blood pressure reading without diagnosis of hypertension - Plan: Comprehensive metabolic panel, Lipid panel  Obesity (BMI 30-39.9)  Special screening for malignant neoplasm of prostate - Plan: PSA  Other malaise and fatigue - Plan: Comprehensive metabolic panel, TSH, CBC with Differential  Anxiety--increase lexapro back to 10mg .  Can consider decreasing back to 1/2 tablet if/when stressors diminish. Encouraged daily exercise, stress reduction techniques.  Cut back on alcohol.  Elevated BP--discussed meds vs trial of lifestyle modification.  Prefers latter--reviewed extensively low sodium diet, daily exercise and weight loss, and improving anxiety.  Cut back on alcohol to just 1-2 drinks per night (or less, to help more with weight loss).  Return in 2-3 months with list of BP and bring in BP monitor to verify accuracy.  To have fasting labs prior to visit.  If needed, will start medication at that point.  Consider restarting toprol XL since he tolerated this without side effects in the past, vs ACE inhibitor.  Likely would not use HCTZ given lack of swelling, and due to long car rides/travel, being in OR  Schedule fasting labs and also CPE.  Discussed importance of returning for CPE, can be put on cancellation list for possible sooner appt.  c-met, lipid, PSA, CBC, TSH--orders entered.

## 2013-01-08 NOTE — Patient Instructions (Signed)
Anxiety--increase lexapro back to 10mg .    Lifestyle changes for now--low sodium diet, daily exercise and weight loss, and improving anxiety.  Cut back on alcohol to just 1-2 drinks per night (less to help more with weight loss).  Excess alcohol can raise blood pressure, hurt your liver, contribute to weight gain and interfere with your sleep.  Return in 2-3 months with list of BP and bring in BP monitor to verify accuracy  Sodium-Controlled Diet Sodium is a mineral. It is found in many foods. Sodium may be found naturally or added during the making of a food. The most common form of sodium is salt, which is made up of sodium and chloride. Reducing your sodium intake involves changing your eating habits. The following guidelines will help you reduce the sodium in your diet:  Stop using the salt shaker.  Use salt sparingly in cooking and baking.  Substitute with sodium-free seasonings and spices.  Do not use a salt substitute (potassium chloride) without your caregiver's permission.  Include a variety of fresh, unprocessed foods in your diet.  Limit the use of processed and convenience foods that are high in sodium. USE THE FOLLOWING FOODS SPARINGLY: Breads/Starches  Commercial bread stuffing, commercial pancake or waffle mixes, coating mixes. Waffles. Croutons. Prepared (boxed or frozen) potato, rice, or noodle mixes that contain salt or sodium. Salted [place] fries or hash browns. Salted popcorn, breads, crackers, chips, or snack foods. Vegetables  Vegetables canned with salt or prepared in cream, butter, or cheese sauces. Sauerkraut. Tomato or vegetable juices canned with salt.  Fresh vegetables are allowed if rinsed thoroughly. Fruit  Fruit is okay to eat. Meat and Meat Substitutes  Salted or smoked meats, such as bacon or Canadian bacon, chipped or corned beef, hot dogs, salt pork, luncheon meats, pastrami, ham, or sausage. Canned or smoked fish, poultry, or meat. Processed  cheese or cheese spreads, blue or [place] cheese. Battered or frozen fish products. Prepared spaghetti sauce. Baked beans. Reuben sandwiches. Salted nuts. Caviar. Milk  Limit buttermilk to 1 cup per week. Soups and Combination Foods  Bouillon cubes, canned or dried soups, broth, consomm. Convenience (frozen or packaged) dinners with more than 600 mg sodium. Pot pies, pizza, Asian food, fast food cheeseburgers, and specialty sandwiches. Desserts and Sweets  Regular (salted) desserts, pie, commercial fruit snack pies, commercial snack cakes, canned puddings.  Eat desserts and sweets in moderation. Fats and Oils  Gravy mixes or canned gravy. No more than 1 to 2 tbs of salad dressing. Chip dips.  Eat fats and oils in moderation. Beverages  See those listed under the vegetables and milk groups. Condiments  Ketchup, mustard, meat sauces, salsa, regular (salted) and lite soy sauce or mustard. Dill pickles, olives, meat tenderizer. Prepared horseradish or pickle relish. Dutch-processed cocoa. Baking powder or baking soda used medicinally. Worcestershire sauce. "Light" salt. Salt substitute, unless approved by your caregiver. Document Released: 07/31/2001 Document Revised: 05/03/2011 Document Reviewed: 03/03/2009 [facility] Patient Information 2014 [place], [place].

## 2013-03-12 ENCOUNTER — Other Ambulatory Visit: Payer: BC Managed Care – PPO

## 2013-03-15 ENCOUNTER — Ambulatory Visit: Payer: BC Managed Care – PPO | Admitting: Family Medicine

## 2013-03-19 ENCOUNTER — Encounter: Payer: Self-pay | Admitting: Family Medicine

## 2013-04-26 ENCOUNTER — Ambulatory Visit (INDEPENDENT_AMBULATORY_CARE_PROVIDER_SITE_OTHER): Payer: PRIVATE HEALTH INSURANCE | Admitting: Family Medicine

## 2013-04-26 ENCOUNTER — Encounter: Payer: Self-pay | Admitting: Family Medicine

## 2013-04-26 VITALS — BP 128/88 | HR 84 | Ht 69.0 in | Wt 238.0 lb

## 2013-04-26 DIAGNOSIS — R0683 Snoring: Secondary | ICD-10-CM

## 2013-04-26 DIAGNOSIS — R0609 Other forms of dyspnea: Secondary | ICD-10-CM

## 2013-04-26 DIAGNOSIS — E669 Obesity, unspecified: Secondary | ICD-10-CM

## 2013-04-26 DIAGNOSIS — F411 Generalized anxiety disorder: Secondary | ICD-10-CM

## 2013-04-26 DIAGNOSIS — E782 Mixed hyperlipidemia: Secondary | ICD-10-CM

## 2013-04-26 DIAGNOSIS — R0989 Other specified symptoms and signs involving the circulatory and respiratory systems: Secondary | ICD-10-CM

## 2013-04-26 DIAGNOSIS — Z0000 Encounter for general adult medical examination without abnormal findings: Secondary | ICD-10-CM

## 2013-04-26 LAB — POCT URINALYSIS DIPSTICK
Bilirubin, UA: NEGATIVE
Glucose, UA: NEGATIVE
KETONES UA: NEGATIVE
Leukocytes, UA: NEGATIVE
Nitrite, UA: NEGATIVE
Protein, UA: NEGATIVE
RBC UA: NEGATIVE
Spec Grav, UA: <=1.005
UROBILINOGEN UA: NEGATIVE
pH, UA: 7

## 2013-04-26 NOTE — Patient Instructions (Signed)
  HEALTH MAINTENANCE RECOMMENDATIONS:  It is recommended that you get at least 30 minutes of aerobic exercise at least 5 days/week (for weight loss, you may need as much as 60-90 minutes). This can be any activity that gets your heart rate up. This can be divided in 10-15 minute intervals if needed, but try and build up your endurance at least once a week.  Weight bearing exercise is also recommended twice weekly.  Eat a healthy diet with lots of vegetables, fruits and fiber.  "Colorful" foods have a lot of vitamins (ie green vegetables, tomatoes, red peppers, etc).  Limit sweet tea, regular sodas and alcoholic beverages, all of which has a lot of calories and sugar.  Up to 2 alcoholic drinks daily may be beneficial for men (unless trying to lose weight, watch sugars).  Drink a lot of water.  Sunscreen of at least SPF 30 should be used on all sun-exposed parts of the skin when outside between the hours of 10 am and 4 pm (not just when at beach or pool, but even with exercise, golf, tennis, and yard work!)  Use a sunscreen that says "broad spectrum" so it covers both UVA and UVB rays, and make sure to reapply every 1-2 hours.  Remember to change the batteries in your smoke detectors when changing your clock times in the spring and fall.  Use your seat belt every time you are in a car, and please drive safely and not be distracted with cell phones and texting while driving.  Ask your wife if you stop breathing (pauses) while asleep at night.  If so, I recommend that you get a sleep study done

## 2013-04-26 NOTE — Progress Notes (Signed)
Chief Complaint  Patient presents with  . Annual Exam    nonfasting annual. Does want to ask a few questions about some "stuffiness" that he experiences behind his ears on occasion.    Eddie Harris is a 53 y.o. male who presents for a complete physical.  He has the following concerns:  Sitting in the car, driving a lot for his job.  Sometimes he feels stuffy, slightly dizzy behind his ears when he gets out of the car to get something to eat.  Lasts about a minute.  No associated URI symptoms, congestion.  No presyncope/syncope, no palpitations, chest pain or shortness of breath.  No associated vertigo.  Anxiety:  He stopped taking the Lexapro since January 1st when the cost went up to >$100.  Denies any recurrent anxiety. He is under a lot of stress related to work, court case.  Immunization History  Administered Date(s) Administered  . Hepatitis A 08/10/2005, 05/27/2006  . Influenza Split 11/26/2012  . Influenza, Seasonal, Injecte, Preservative Fre 02/07/2012  . PPD Test 05/14/2011, 02/07/2012, 05/11/2012  . Tdap 08/10/2005   Last colonoscopy: never (has previously seen [name] for EGD) Last PSA: 07/2011 Ophtho: years ago, maybe 3 Dentist: over 2 years ago, previously went every 6 months Exercise:  Since his last visit here he has been exercising more--treadmill, elliptical weights, almost daily.  Past Medical History  Diagnosis Date  . Atrial fibrillation 2000    negative cardiac workup ([name]).  Episode occurred after heavy drinking  . Hyperlipidemia   . Anxiety   . GERD (gastroesophageal reflux disease)     silent--EGD [name]; pt denies reflux, saw duodenal irritation on EGD  . Ocular migraine     [name]  . Skin cancer, basal cell     [name]  . Paroxysmal atrial fibrillation   . Mild obesity   . Pyloric stenosis     as an infant; vs hernia repair--both documented in his chart  . PVC (premature ventricular contraction)     Past Surgical  History  Procedure Laterality Date  . Hernia repair  age 8 weeks    ?diaphragmatic given age  . Basal cell skin cancer excision      History   Social History  . Marital Status: Married    Spouse Name: N/A    Number of Children: 2  . Years of Education: N/A   Occupational History  . sells medical instruments (medical supplies)    Social History Main Topics  . Smoking status: Former Smoker    Quit date: 02/23/1987  . Smokeless tobacco: Never Used  . Alcohol Use: Yes     Comment: 3-4 drinks 3 nights/week  . Drug Use: No  . Sexual Activity: Yes    Partners: Female     Comment: infrequent   Other Topics Concern  . Not on file   Social History Narrative   Married, 2 children. Moved office back into his house (due to financial strains)    Family History  Problem Relation Age of Onset  . Adopted: Yes  . Asthma Son    Outpatient Encounter Prescriptions as of 04/26/2013  Medication Sig  . ALPHA LIPOIC ACID PO Take 1-2 tablets by mouth daily.  . Arginine POWD 1-2 scoop by Does not apply route daily.  Marland Kitchen CALCIUM & MAGNESIUM CARBONATES PO Take 1 tablet by mouth daily.    . Coenzyme Q10 10 MG capsule Take 10 mg by mouth daily.  Marland Kitchen  fish oil-omega-3 fatty acids 1000 MG capsule Take 2 g by mouth daily.   . vitamin C (ASCORBIC ACID) 500 MG tablet Take 500 mg by mouth daily.    Marland Kitchen escitalopram (LEXAPRO) 10 MG tablet Take 1 tablet (10 mg total) by mouth daily.   Allergies  Allergen Reactions  . Shellfish-Derived Products Anaphylaxis and Nausea And Vomiting   ROS:  The patient denies anorexia, fever, headaches, vision loss, decreased hearing, ear pain, hoarseness, chest pain, palpitations, dizziness, syncope, dyspnea on exertion, cough, swelling, nausea, vomiting, diarrhea, constipation, abdominal pain, melena, hematochezia, indigestion/heartburn, hematuria, incontinence, erectile dysfunction, nocturia, weakened urine stream, dysuria, genital lesions, joint pains, numbness, tingling,  weakness, tremor, suspicious skin lesions, depression, abnormal bleeding/bruising, or enlarged lymph nodes Some weight gain Occasional PVC.  Denies tachycardia. Has trouble swallowing capsules, unless he takes with a lot of food. +snoring.  Not aware of any apnea.  Denies daytime somnolence, often feels refreshed in the mornings, if he slept well  PHYSICAL EXAM: BP 128/88  Pulse 84  Ht 5\' 9"  (1.753 m)  Wt 238 lb (107.956 kg)  BMI 35.13 kg/m2  General Appearance:    Alert, cooperative, no distress, appears stated age  Head:    Normocephalic, without obvious abnormality, atraumatic  Eyes:    PERRL, conjunctiva/corneas clear, EOM's intact, fundi    benign  Ears:    Normal TM's and external ear canals  Nose:   Nares normal, mucosa normal, no drainage or sinus   tenderness  Throat:   Lips, mucosa, and tongue normal; teeth and gums normal  Neck:   Supple, no lymphadenopathy;  thyroid:  no   enlargement/tenderness/nodules; no carotid   bruit or JVD  Back:    Spine nontender, no curvature, ROM normal, no CVA     tenderness  Lungs:     Clear to auscultation bilaterally without wheezes, rales or     ronchi; respirations unlabored  Chest Wall:    No tenderness or deformity   Heart:    Regular rate and rhythm, S1 and S2 normal, no murmur, rub   or gallop  Breast Exam:    No chest wall tenderness, masses or gynecomastia  Abdomen:     Soft, non-tender, nondistended, normoactive bowel sounds,    no masses, no hepatosplenomegaly  Genitalia:    Normal male external genitalia without lesions.  Testicles without masses.  No inguinal hernias.  Rectal:    Normal sphincter tone, no masses or tenderness; guaiac negative stool.  Prostate smooth, no nodules, not enlarged.  Extremities:   No clubbing, cyanosis or edema  Pulses:   2+ and symmetric all extremities  Skin:   Skin color, texture, turgor normal, no rashes or lesions  Lymph nodes:   Cervical, supraclavicular, and axillary nodes normal   Neurologic:   CNII-XII intact, normal strength, sensation and gait; reflexes 2+ and symmetric throughout          Psych:   Normal mood, affect, hygiene and grooming.    ASSESSMENT/PLAN:  Routine general medical examination at a health care facility - Plan: Visual acuity screening, POCT Urinalysis Dipstick  Obesity (BMI 30-39.9) - weight loss encouraged. risks reviewed.  diet/exercise   Mixed hyperlipidemia - orders have been in system--won't be able to get here for fasting labs until next week  Anxiety state, unspecified - off Lexapro for about 2 months, and so far no recurrent anxiety.  If any symptoms recur, plan to start celexa (less expensive, equally as effective as lexapro)  Snoring -  pt to check with wife re: apnea.  denies daytime somnolence.  consider sleep study.  weight loss encouraged   Discussed citalopram at length.  If anxiety recurs/worsens, call for prescription.  Discussed PSA screening (risks/benefits), recommended at least 30 minutes of aerobic activity at least 5 days/week; proper sunscreen use reviewed; healthy diet and alcohol recommendations (less than or equal to 2 drinks/day) reviewed; regular seatbelt use; changing batteries in smoke detectors. Self-testicular exams. Immunization recommendations discussed--UTD.  Colonoscopy recommendations reviewed--recommended.  He will call to schedule routine screening colonoscopy with [name]  PPD next week (needs for work; advised he can come any day except Thurs), and fasting labs (future orders are already in system).

## 2013-05-04 ENCOUNTER — Other Ambulatory Visit: Payer: PRIVATE HEALTH INSURANCE

## 2013-05-07 ENCOUNTER — Telehealth: Payer: Self-pay | Admitting: Family Medicine

## 2013-05-07 NOTE — Telephone Encounter (Signed)
Left message for pt to reschedule labs visit missed on Friday 05/04/2013.

## 2013-06-27 ENCOUNTER — Other Ambulatory Visit (INDEPENDENT_AMBULATORY_CARE_PROVIDER_SITE_OTHER): Payer: PRIVATE HEALTH INSURANCE

## 2013-06-27 DIAGNOSIS — Z111 Encounter for screening for respiratory tuberculosis: Secondary | ICD-10-CM

## 2013-06-29 LAB — TB SKIN TEST
Induration: 0 mm
TB Skin Test: NEGATIVE

## 2013-09-12 ENCOUNTER — Encounter: Payer: Self-pay | Admitting: Family Medicine

## 2013-09-12 ENCOUNTER — Ambulatory Visit (INDEPENDENT_AMBULATORY_CARE_PROVIDER_SITE_OTHER): Payer: PRIVATE HEALTH INSURANCE | Admitting: Family Medicine

## 2013-09-12 VITALS — BP 148/100 | HR 88 | Temp 98.6°F | Ht 69.0 in | Wt 231.0 lb

## 2013-09-12 DIAGNOSIS — L0211 Cutaneous abscess of neck: Secondary | ICD-10-CM

## 2013-09-12 DIAGNOSIS — L255 Unspecified contact dermatitis due to plants, except food: Secondary | ICD-10-CM

## 2013-09-12 DIAGNOSIS — L03221 Cellulitis of neck: Secondary | ICD-10-CM

## 2013-09-12 MED ORDER — METHYLPREDNISOLONE ACETATE 80 MG/ML IJ SUSP
80.0000 mg | Freq: Once | INTRAMUSCULAR | Status: AC
  Administered 2013-09-12: 80 mg via INTRAMUSCULAR

## 2013-09-12 MED ORDER — CEPHALEXIN 500 MG PO CAPS: 500.0000 mg | ORAL_CAPSULE | Freq: Three times a day (TID) | ORAL | Status: DC

## 2013-09-12 MED ORDER — CEFTRIAXONE SODIUM 1 G IJ SOLR
1.0000 g | INTRAMUSCULAR | Status: DC
  Administered 2013-09-12: 1 g via INTRAMUSCULAR

## 2013-09-12 NOTE — Progress Notes (Signed)
Chief Complaint  Patient presents with  . Rash    was doing yardwork, thinks he has poison sumac. Started Sunday and has progressively gotten worse.    On Sunday, he was playing [employer], and had used leaves under a headband (over a hat) as part of camouflage.  The following day he noticed some itching and burning on the back of his scalp and neck, but later that night it got worse (he was not aware of any rash at that time).  Yesterday he woke up and noted the right side of the neck was swollen, weeping/draining clear yellow liquid.   Yesterday he also noticed lesions on the arms, waistband, penis, small lesion on right eyelid, cheeks.  He later looked up online, and realizes that the leaves he used for camouflage were likely poison sumac.  He has been applying calamine lotion, and using some benadryl and advil.  Denies fevers, chills.  Denies any sore throat, shortnes of breath, mouth/tongue swelling.  He has noticed swelling in the right side of his neck.  Past Medical History  Diagnosis Date  . Atrial fibrillation 2000    negative cardiac workup ([name]).  Episode occurred after heavy drinking  . Hyperlipidemia   . Anxiety   . GERD (gastroesophageal reflux disease)     silent--EGD [name]; pt denies reflux, saw duodenal irritation on EGD  . Ocular migraine     [name]  . Skin cancer, basal cell     [name]  . Paroxysmal atrial fibrillation   . Mild obesity   . Pyloric stenosis     as an infant; vs hernia repair--both documented in his chart  . PVC (premature ventricular contraction)    Past Surgical History  Procedure Laterality Date  . Hernia repair  age 22 weeks    ?diaphragmatic given age  . Basal cell skin cancer excision     History   Social History  . Marital Status: Married    Spouse Name: N/A    Number of Children: 2  . Years of Education: N/A   Occupational History  . sells medical instruments (medical supplies)    Social History  Main Topics  . Smoking status: Former Smoker    Quit date: 02/23/1987  . Smokeless tobacco: Never Used  . Alcohol Use: Yes     Comment: 3-4 drinks 3 nights/week  . Drug Use: No  . Sexual Activity: Yes    Partners: Female     Comment: infrequent   Other Topics Concern  . Not on file   Social History Narrative   Married, 2 children. 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 Shellfish-Derived Products Anaphylaxis and Nausea And Vomiting   ROS:  No fevers, chills, chest pain, shortness of breath, bleeding, nausea, vomiting, dizziness.  See HPI  PHYSICAL EXAM: BP 148/100  Pulse 88  Temp(Src) 98.6 F (37 C) (Tympanic)  Ht 5\' 9"  (1.753 m)  Wt 231 lb (104.781 kg)  BMI 34.10 kg/m2  Well developed male in no distress, constantly dabbing/blotting at the weeping area on his right neck. Skin:  Back of scalp--right side, inferior portion of scalp, in hair-bearing area there is erythema, weeping and peeling skin.  This extends inferiorly to cover the entire right side of posterior neck.  It is red, warm, weeping.  The skin is thickened, and there is loose dead peeling skin (that is slightly white-pink colored due  to the calamine lotion).  There is well demarcated area of redness, ending at collar line  Maceration, redness also present behind right ear. Red papules noted on either side of nose/cheeks, tip of nose  Red papules vs small blister on both arms  Some soft tissue swelling noted in right neck, but no lymphadenopathy appreciated. OP is clear, without swelling or mucosal lesions.   ASSESSMENT/PLAN:  Dermatitis due to plants, including poison ivy, sumac, and oak - Plan: methylPREDNISolone acetate (DEPO-MEDROL) injection 80 mg, cefTRIAXone (ROCEPHIN) injection 1 g  Cellulitis of neck - cellulitis given redness, warmth swelling.  cannot r/o any component of sunburn - Plan: cephALEXin (KEFLEX) 500 MG capsule, methylPREDNISolone  acetate (DEPO-MEDROL) injection 80 mg, cefTRIAXone (ROCEPHIN) injection 1 g   Depo medrol 80mg  today. Call if/when symptoms recur for possible oral course of steroids to lengthen the course, if needed  Treat for concomitant infection given degress of redness, induration, swelling and drainage.  He won't be able to get to pharmacy right away today. Therefore, will give dose of rocephin today, and start Keflex tomorrow.  Return for recheck within the week if not improving.  Risks/side effects of meds reviewed in detail

## 2013-09-12 NOTE — Patient Instructions (Signed)
Start the keflex tomorrow (antibiotic). You were given antibiotic and steroid injections today.  Call later this week or next week if you have recurrent itching/rash (after initial improvement from the shot)--you may need a longer course of steroids, requiring a prescription for prednisone or a dosepak.  Get seen immediately if high fevers, chills, vomiting, especially if any shortness of breath, dizziness or other new concerns.  Cellulitis Cellulitis is an infection of the skin and the tissue beneath it. The infected area is usually red and tender. Cellulitis occurs most often in the arms and lower legs.  CAUSES  Cellulitis is caused by bacteria that enter the skin through cracks or cuts in the skin. The most common types of bacteria that cause cellulitis are Staphylococcus and Streptococcus. SYMPTOMS   Redness and warmth.  Swelling.  Tenderness or pain.  Fever. DIAGNOSIS  Your caregiver can usually determine what is wrong based on a physical exam. Blood tests may also be done. TREATMENT  Treatment usually involves taking an antibiotic medicine. HOME CARE INSTRUCTIONS   Take your antibiotics as directed. Finish them even if you start to feel better.  Keep the infected arm or leg elevated to reduce swelling.  Apply a warm cloth to the affected area up to 4 times per day to relieve pain.  Only take over-the-counter or prescription medicines for pain, discomfort, or fever as directed by your caregiver.  Keep all follow-up appointments as directed by your caregiver. SEEK MEDICAL CARE IF:   You notice red streaks coming from the infected area.  Your red area gets larger or turns dark in color.  Your bone or joint underneath the infected area becomes painful after the skin has healed.  Your infection returns in the same area or another area.  You notice a swollen bump in the infected area.  You develop new symptoms. SEEK IMMEDIATE MEDICAL CARE IF:   You have a  fever.  You feel very sleepy.  You develop vomiting or diarrhea.  You have a general ill feeling (malaise) with muscle aches and pains. MAKE SURE YOU:   Understand these instructions.  Will watch your condition.  Will get help right away if you are not doing well or get worse. Document Released: 11/18/2004 Document Revised: 08/10/2011 Document Reviewed: 04/26/2011 [facility] Patient Information 2015 [place], [place]. This information is not intended to replace advice given to you by your health care provider. Make sure you discuss any questions you have with your health care provider.

## 2013-09-17 ENCOUNTER — Telehealth: Payer: Self-pay | Admitting: *Deleted

## 2013-09-17 NOTE — Telephone Encounter (Signed)
Spoke with patient and he will check his schedule and call back tomorrow to schedule lab visit ASAP.

## 2013-09-17 NOTE — Telephone Encounter (Signed)
Message copied by Clinton Sawyer on Mon Sep 17, 2013  5:08 PM ------      Message from: KNAPP, EVE      Created: Wed Sep 12, 2013  9:42 PM       I just got notified today of his expired orders--he never returned for fasting labs (he missed his visit).  He needs to schedule a fasting lab visit (I extended orders) ------

## 2013-09-17 NOTE — Progress Notes (Signed)
Patient will call back to schedule ?

## 2013-09-20 ENCOUNTER — Telehealth: Payer: Self-pay | Admitting: *Deleted

## 2013-09-20 MED ORDER — METHYLPREDNISOLONE 4 MG PO KIT: PACK | ORAL | Status: DC

## 2013-09-20 NOTE — Telephone Encounter (Signed)
Okay for medrol dosepak

## 2013-09-20 NOTE — Telephone Encounter (Signed)
Patient called and reports that the swelling on his neck has gone down some. He is somewhat better, improving but still has tender, red rash on the back of his neck. Still has itchy bumps. Thought you mentioned something about perhaps taking an oral steroid if he was not much better 5-7 days after shot. Please advise.

## 2013-09-20 NOTE — Telephone Encounter (Signed)
rx sent to [facility].

## 2014-12-19 ENCOUNTER — Ambulatory Visit (INDEPENDENT_AMBULATORY_CARE_PROVIDER_SITE_OTHER): Payer: No Typology Code available for payment source | Admitting: Family Medicine

## 2014-12-19 ENCOUNTER — Encounter: Payer: Self-pay | Admitting: Family Medicine

## 2014-12-19 VITALS — BP 150/110 | HR 96 | Ht 69.0 in | Wt 232.8 lb

## 2014-12-19 DIAGNOSIS — Z125 Encounter for screening for malignant neoplasm of prostate: Secondary | ICD-10-CM

## 2014-12-19 DIAGNOSIS — R5383 Other fatigue: Secondary | ICD-10-CM

## 2014-12-19 DIAGNOSIS — E7800 Pure hypercholesterolemia, unspecified: Secondary | ICD-10-CM

## 2014-12-19 DIAGNOSIS — Z5181 Encounter for therapeutic drug level monitoring: Secondary | ICD-10-CM

## 2014-12-19 DIAGNOSIS — IMO0001 Reserved for inherently not codable concepts without codable children: Secondary | ICD-10-CM

## 2014-12-19 DIAGNOSIS — R002 Palpitations: Secondary | ICD-10-CM

## 2014-12-19 DIAGNOSIS — Z131 Encounter for screening for diabetes mellitus: Secondary | ICD-10-CM

## 2014-12-19 DIAGNOSIS — R030 Elevated blood-pressure reading, without diagnosis of hypertension: Secondary | ICD-10-CM

## 2014-12-19 DIAGNOSIS — I10 Essential (primary) hypertension: Secondary | ICD-10-CM | POA: Diagnosis not present

## 2014-12-19 LAB — COMPREHENSIVE METABOLIC PANEL
ALBUMIN: 4.6 g/dL (ref 3.6–5.1)
ALK PHOS: 43 U/L (ref 40–115)
ALT: 38 U/L (ref 9–46)
AST: 28 U/L (ref 10–35)
BILIRUBIN TOTAL: 0.8 mg/dL (ref 0.2–1.2)
BUN: 16 mg/dL (ref 7–25)
CALCIUM: 9.4 mg/dL (ref 8.6–10.3)
CO2: 25 mmol/L (ref 20–31)
Chloride: 101 mmol/L (ref 98–110)
Creat: 0.96 mg/dL (ref 0.70–1.33)
GLUCOSE: 112 mg/dL — AB (ref 65–99)
POTASSIUM: 4.2 mmol/L (ref 3.5–5.3)
Sodium: 138 mmol/L (ref 135–146)
Total Protein: 7.4 g/dL (ref 6.1–8.1)

## 2014-12-19 LAB — CBC WITH DIFFERENTIAL/PLATELET
Basophils Absolute: 0.1 10*3/uL (ref 0.0–0.1)
Basophils Relative: 1 % (ref 0–1)
EOS ABS: 0.1 10*3/uL (ref 0.0–0.7)
Eosinophils Relative: 1 % (ref 0–5)
HCT: 44.4 % (ref 39.0–52.0)
Hemoglobin: 15.4 g/dL (ref 13.0–17.0)
Lymphocytes Relative: 31 % (ref 12–46)
Lymphs Abs: 2.7 10*3/uL (ref 0.7–4.0)
MCH: 32.1 pg (ref 26.0–34.0)
MCHC: 34.7 g/dL (ref 30.0–36.0)
MCV: 92.5 fL (ref 78.0–100.0)
MONO ABS: 0.7 10*3/uL (ref 0.1–1.0)
MONOS PCT: 8 % (ref 3–12)
MPV: 9.9 fL (ref 8.6–12.4)
Neutro Abs: 5.2 10*3/uL (ref 1.7–7.7)
Neutrophils Relative %: 59 % (ref 43–77)
Platelets: 215 10*3/uL (ref 150–400)
RBC: 4.8 MIL/uL (ref 4.22–5.81)
RDW: 13.2 % (ref 11.5–15.5)
WBC: 8.8 10*3/uL (ref 4.0–10.5)

## 2014-12-19 LAB — TSH: TSH: 2.751 u[IU]/mL (ref 0.350–4.500)

## 2014-12-19 MED ORDER — METOPROLOL SUCCINATE ER 50 MG PO TB24: ORAL_TABLET | ORAL | Status: DC

## 2014-12-19 NOTE — Progress Notes (Signed)
Chief Complaint  Patient presents with  . Irregular Heart Beat    has had PVC's in the past. Over the last 48-72 hrs has been having lots of irregular heartbeats, especially at night while lying in bed.    Patient presents as a walk-in today with complaint of palpitations. He has always experienced occasionally PVC's, usually with stress. Over the last 2-3 days, in the night/evenings, he has been noticing them more often, sometimes every 2-4 beats for a little while.  Denies any associated dizziness, chest pain, SOB. He is exercising regularly 3-4 days/week, and symptoms or problems with exercise.  12 ounces of coffee each day (morning), no other caffeine. Hasn't taken decongestants or other stimulants.  He previously took Toprol (for an episode of afib, many years ago).  He didn't have any side effects from this medication. He checked his BP yesterday (after work), and it was 125/86.  He checks it 2-3 times/week.  Diastolic is often 94'W (ranging 54-62), systolic ranges from 703'J-009. He reports that his usual pulse runs 15-82  +stress--53 yo father lives alone in [place] (with nearby flooding), on oxygen, 53 yo wife just separated from him.  He also has work stress. 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  He takes a lot of OTC supplements.  He started taking Vitamin D after reading about potential benefits.  He started taking 20,000 IU daily, for about a month (2 BID of 5000 IU dose), then cut back to 10000/day. In the last couple of weeks he cut back to just one tablet (5000 IU) daily, as he realized that some of his other supplements also had vitamin D.  He is not fasting today, ate 1-1.5 hours ago.  BP Readings from Last 3 Encounters:  12/19/14 150/110  09/12/13 148/100  04/26/13 128/88   Past Medical History  Diagnosis Date  . Atrial fibrillation ([place]) 2000    negative cardiac workup ([name]).   Episode occurred after heavy drinking  . Hyperlipidemia   . Anxiety   . GERD (gastroesophageal reflux disease)     silent--EGD [name]; pt denies reflux, saw duodenal irritation on EGD  . Ocular migraine     [name]  . Skin cancer, basal cell     [name]  . Paroxysmal atrial fibrillation ([facility])   . Mild obesity   . Pyloric stenosis     as an infant; vs hernia repair--both documented in his chart  . PVC (premature ventricular contraction)    Past Surgical History  Procedure Laterality Date  . Hernia repair  age 53 weeks    ?diaphragmatic given age  . Basal cell skin cancer excision     Social History   Social History  . Marital Status: Married    Spouse Name: N/A  . Number of Children: 2  . Years of Education: N/A   Occupational History  . sells medical instruments (medical supplies)    Social History Main Topics  . Smoking status: Former Smoker    Quit date: 02/23/1987  . Smokeless tobacco: Never Used  . Alcohol Use: Yes     Comment: 3-4 drinks 3 nights/week  . Drug Use: No  . Sexual Activity:    Partners: Female     Comment: infrequent   Other Topics Concern  . Not on file   Social History Narrative   Married, 2 children. Moved office back into his house (due  to financial strains)   Family History  Problem Relation Age of Onset  . Adopted: Yes  . Asthma Son    Outpatient Encounter Prescriptions as of 12/19/2014  Medication Sig  . ALPHA LIPOIC ACID PO Take 1-2 tablets by mouth daily.  . Arginine POWD 1-2 scoop by Does not apply route daily.  Marland Kitchen CALCIUM & MAGNESIUM CARBONATES PO Take 1 tablet by mouth daily.    . Coenzyme Q10 10 MG capsule Take 10 mg by mouth daily.  . fish oil-omega-3 fatty acids 1000 MG capsule Take 2 g by mouth daily.   . vitamin C (ASCORBIC ACID) 500 MG tablet Take 500 mg by mouth daily.    . metoprolol succinate (TOPROL-XL) 50 MG 24 hr tablet Start at 1/2 tablet by mouth daily.  Increase to full tablet after a few  days, if needed  . [DISCONTINUED] cephALEXin (KEFLEX) 500 MG capsule Take 1 capsule (500 mg total) by mouth 3 (three) times daily.  . [DISCONTINUED] methylPREDNISolone (MEDROL DOSEPAK) 4 MG tablet follow package directions   Facility-Administered Encounter Medications as of 12/19/2014  Medication  . cefTRIAXone (ROCEPHIN) injection 1 g   Metoprolol started today, not prior to visit. Also taking Vitamin D 5000 IU, as per HPI.  Allergies  Allergen Reactions  . Shellfish-Derived Products Anaphylaxis and Nausea And Vomiting   ROS:  Denies headaches, dizziness, fever, chills, URI symptoms, numbness, tingling, weakness.  He has occ heartburn; no dysphagia, no vomiting, diarrhea or other bowel changes. No bleeding, bruising, rash.  +stress, denies depression. Denies muscle cramps or spasms.  PHYSICAL EXAM: BP 150/110 mmHg  Pulse 96  Ht 5\' 9"  (1.753 m)  Wt 232 lb 12.8 oz (105.597 kg)  BMI 34.36 kg/m2  148/106 on repeat by MD Pulse 104 Well developed, mildly anxious appearing male in no distress HEENT: PERRL, EOMI, conjunctiva clear. OP clear Neck: no lymphadenopathy, thyromegaly or mass Heart: mild tachycardia. Regular rhythm. No murmur, rub, or ectopy. Lungs: clear bilaterally Abdomen: soft, nontender, no organomegaly or mass Extremities: no edema Psych: slightly anxious. Full range of affect. Normal hygiene, grooming, speech and eye contact. Neuro: alert and oriented. Normal cranial nerves, strength, gait.  EKG: NSR, rate 94.  Normal rhythm strip, no PVC's. Possible LAE, low voltage QRS. No acute abnormalities--need to pull paper chart to see if prior ekg's available for comparison  ASSESSMENT/PLAN:  Palpitations - suspect PVC's; trial of beta blocker - Plan: CBC with Differential/Platelet, TSH, Comprehensive metabolic panel, metoprolol succinate (TOPROL-XL) 50 MG 24 hr tablet, EKG 12-Lead  Essential hypertension - Pt has had elevated BP's in the past. Home diastolic BP's are  continuing to run high--start Toprol XL - Plan: metoprolol succinate (TOPROL-XL) 50 MG 24 hr tablet, EKG 12-Lead  Elevated blood pressure - Plan: EKG 12-Lead  Other fatigue - Plan: Vit D  25 hydroxy (rtn osteoporosis monitoring), TSH  Medication monitoring encounter - Plan: Vit D  25 hydroxy (rtn osteoporosis monitoring)  Screening for diabetes mellitus - Plan: Glucose, random  Screening for prostate cancer - Plan: PSA  Pure hypercholesterolemia - Plan: Lipid panel   Toprol XL 50mg . Start at 1/2 tablet once daily, and increase to full in 3-4 days if BP's remain above goal and still symptomatic.  Goal BP is 120/70, up to 130-135/80-85 is acceptable/borderline. Pulse should be 60 or higher.  A little lower, but without any symptoms, is fine. Keep track of your blood pressure and pulse, and bring in the piece of paper with all of your readings  to your next visit.  Do not take more than 2000 IU of Vitamin D daily--but we will let you know more once we get the labs back.  Return fasting for labs (just lipids and glucose--checked nonfasting labs today given symptoms and high vitamin D intake, to r/o electrolyte abnl).  Schedule physical

## 2014-12-19 NOTE — Patient Instructions (Signed)
  Toprol XL 50mg . Start at 1/2 tablet once daily, and increase to full in 3-4 days if BP's remain above goal and still symptomatic.  Goal BP is 120/70, up to 130-135/80-85 is acceptable/borderline. Pulse should be 60 or higher.  A little lower, but without any symptoms, is fine. Keep track of your blood pressure and pulse, and bring in the piece of paper with all of your readings to your next visit.  Do not take more than 2000 IU of Vitamin D daily--but we will let you know more once we get the labs back.  Return fasting for labs   Schedule physical

## 2014-12-20 LAB — PSA: PSA: 1.21 ng/mL (ref <=4.00)

## 2014-12-20 LAB — VITAMIN D 25 HYDROXY (VIT D DEFICIENCY, FRACTURES): Vit D, 25-Hydroxy: 81 ng/mL (ref 30–100)

## 2015-01-20 ENCOUNTER — Ambulatory Visit: Payer: No Typology Code available for payment source | Admitting: Family Medicine

## 2015-01-23 ENCOUNTER — Ambulatory Visit (INDEPENDENT_AMBULATORY_CARE_PROVIDER_SITE_OTHER): Payer: No Typology Code available for payment source | Admitting: Family Medicine

## 2015-01-23 ENCOUNTER — Encounter: Payer: Self-pay | Admitting: Family Medicine

## 2015-01-23 VITALS — BP 160/110 | HR 100 | Ht 69.0 in | Wt 232.0 lb

## 2015-01-23 DIAGNOSIS — J069 Acute upper respiratory infection, unspecified: Secondary | ICD-10-CM

## 2015-01-23 DIAGNOSIS — I10 Essential (primary) hypertension: Secondary | ICD-10-CM | POA: Diagnosis not present

## 2015-01-23 DIAGNOSIS — R0683 Snoring: Secondary | ICD-10-CM

## 2015-01-23 DIAGNOSIS — E669 Obesity, unspecified: Secondary | ICD-10-CM

## 2015-01-23 DIAGNOSIS — E782 Mixed hyperlipidemia: Secondary | ICD-10-CM | POA: Diagnosis not present

## 2015-01-23 NOTE — Progress Notes (Signed)
Chief Complaint  Patient presents with  . Hypertension    1 month follow up.    Patient presents for follow up on hypertension.  He was started on metoprolol succinate 50mg  at last visit --supposed to start at 1/2 tablet and increase to full.  He has only been taking 1/2 tablet daily.  BP's have been good so didn't increase to the full tablet.  Denies any side effects. Denies headaches, dizziness. BP's are 120's-130's/78-85 when checking BP at home. Pulse 70-80. He periodically checks his pulse while driving, and it is in the 70's.  He brought his machine to visit today to verify the accuracy, but forgot to bring the list of BP's (machine doesn't have memory button).  He was sick with a bad cold last week, coughing up green stuff. He had a severe sore throat. Today his throat feels better, but today has a sinus headache. He has less mucus production, and getting lighter in color. He took a homeopathic "wellness formula" for colds. Denies taking decongestants +sick contact (daughter gave it to him).  PMH, PSH, SH reviewed.  Outpatient Encounter Prescriptions as of 01/23/2015  Medication Sig  . ALPHA LIPOIC ACID PO Take 1-2 tablets by mouth daily.  . Arginine POWD 1-2 scoop by Does not apply route daily.  Marland Kitchen CALCIUM & MAGNESIUM CARBONATES PO Take 1 tablet by mouth daily.    . Coenzyme Q10 10 MG capsule Take 10 mg by mouth daily.  . fish oil-omega-3 fatty acids 1000 MG capsule Take 2 g by mouth daily.   . metoprolol succinate (TOPROL-XL) 50 MG 24 hr tablet Start at 1/2 tablet by mouth daily.  Increase to full tablet after a few days, if needed  . vitamin C (ASCORBIC ACID) 500 MG tablet Take 500 mg by mouth daily.     Facility-Administered Encounter Medications as of 01/23/2015  Medication  . cefTRIAXone (ROCEPHIN) injection 1 g   Allergies  Allergen Reactions  . Shellfish-Derived Products Anaphylaxis and Nausea And Vomiting   ROS: no fever, chills.  +URI symptoms as per HPI.  No nausea,  vomiting, diarrhea, no chest pain palpitations, shortness of breath. No bleeding, bruising, rash. +snoring. Denies daytime somnolence, does a lot of driving without problems. Denies depression. +stressors  PHYSICAL EXAM: BP 160/110 mmHg  Pulse 100  Ht 5\' 9"  (1.753 m)  Wt 232 lb (105.235 kg)  BMI 34.24 kg/m2  Patient's machine 156/106 Excitable gentleman, who appears anxious, with many many questions/anxious HEENT: PERRL, EOMI, conjunctiva clear. TM's and EACs normal.  Nasal mucosa is moderately edematous, no erythema, clear mucus noted.  Sinuses nontender. OP is clear without erythema or exudate. Moist mucus membranes.  Neck: no lymphadenopathy, thyromegaly or mass Heart: regular rate and rhythm--somewhat tachycardic at first, slowed down after calming down some, but BP didn't really improve much Lungs: clear bilaterally Abdomen: soft, obese,  nontender Extremities: no edema Psych: anxious/excitable. Full range of affect. Normal hygiene, grooming, speech and eye contact Neuro: alert and oriented. Cranial nerves intact. Normal strength, gait Skin: no rashes or visible lesions   Lab Results  Component Value Date   WBC 8.8 12/19/2014   HGB 15.4 12/19/2014   HCT 44.4 12/19/2014   MCV 92.5 12/19/2014   PLT 215 12/19/2014   Lab Results  Component Value Date   PSA 1.21 12/19/2014   PSA 0.97 08/20/2011   Lab Results  Component Value Date   TSH 2.751 12/19/2014     Chemistry      Component Value  Date/Time   NA 138 12/19/2014 0001   K 4.2 12/19/2014 0001   CL 101 12/19/2014 0001   CO2 25 12/19/2014 0001   BUN 16 12/19/2014 0001   CREATININE 0.96 12/19/2014 0001      Component Value Date/Time   CALCIUM 9.4 12/19/2014 0001   ALKPHOS 43 12/19/2014 0001   AST 28 12/19/2014 0001   ALT 38 12/19/2014 0001   BILITOT 0.8 12/19/2014 0001     Glucose 112 (nonfasting) Vitamin D-OH 81  ASSESSMENT/PLAN:  Essential hypertension, benign - Monitor is accurate, and readings at home  are lower. Exercise, weight loss, low sodium diet, continue to monitor  Obesity (BMI 30-39.9) - encouraged weight loss.  He wanted to "bet" he would be 190# at his f/u in May. Discussed healthy ways of losing weight, importance of keeping it off  Snoring - will check with wife re: apnea, which can contribute to elevated BP. Consider sleep study. Weight loss encouraged  Mixed hyperlipidemia - reminded to return for fasting lipids, as ordered  Acute upper respiratory infection - supportive measures reviewed. no evidence of bacterial infection on today's exam.   Discussed sleep apnea in detail, mainly because he had a lot of questions.  He will check with his wife  White coat component to HTN--okay at home, therefore continue at 1/2 tablet of toprol XL. Continue current meds and monitoring. Bring in list of BP's to next visit. Weight loss encouraged. He wanted to "bet" he would be down to 190# by May.  We discussed reasonable expectations, how sometimes quick weight loss (due to low carb diets) are not maintained longterm, etc.  30 min visit,  More than 1/2 spent counseling, educating, answering his many questions.    Ask your wife if you take long pauses in your breathing when snoring at night.  If so, we should consider a sleep study to evaluate for sleep apnea. If you just snore, there is no increased cardiac risks.   Drink plenty of fluids. Use Mucinex (plain) as needed to help loosen the thick mucus. Consider sinus rinses (or Neti-pot) If your mucus again changes to a darker green, any fevers, then it is likely developing a secondary bacterial infection, and will need an antibiotic. It sounds as thought it is improving.  Your blood pressure numbers at home seem good, and your monitor is accurate. Continue checking at home, keep list and bring your list to follow up in May. Return sooner if your blood pressure is consistently >135-140/85-90--if that is the case, and pulse is >70,  then I would recommend you increase the dose to the full 50mg  tablet, and follow up in the office within a month of being on the higher dose.  Work on weight loss.

## 2015-01-23 NOTE — Patient Instructions (Signed)
  Ask your wife if you take long pauses in your breathing when snoring at night.  If so, we should consider a sleep study to evaluate for sleep apnea. If you just snore, there is no increased cardiac risks.   Drink plenty of fluids. Use Mucinex (plain) as needed to help loosen the thick mucus. Consider sinus rinses (or Neti-pot) If your mucus again changes to a darker green, any fevers, then it is likely developing a secondary bacterial infection, and will need an antibiotic. It sounds as thought it is improving.  Your blood pressure numbers at home seem good, and your monitor is accurate. Continue checking at home, keep list and bring your list to follow up in May. Return sooner if your blood pressure is consistently >135-140/85-90--if that is the case, and pulse is >70, then I would recommend you increase the dose to the full 50mg  tablet, and follow up in the office within a month of being on the higher dose.  Work on weight loss.

## 2015-01-24 DIAGNOSIS — I10 Essential (primary) hypertension: Secondary | ICD-10-CM | POA: Insufficient documentation

## 2015-04-15 ENCOUNTER — Other Ambulatory Visit: Payer: Self-pay | Admitting: Family Medicine

## 2015-06-18 ENCOUNTER — Other Ambulatory Visit (INDEPENDENT_AMBULATORY_CARE_PROVIDER_SITE_OTHER): Payer: No Typology Code available for payment source

## 2015-06-18 DIAGNOSIS — Z111 Encounter for screening for respiratory tuberculosis: Secondary | ICD-10-CM | POA: Diagnosis not present

## 2015-06-20 LAB — TB SKIN TEST
INDURATION: NEGATIVE mm
TB SKIN TEST: NEGATIVE

## 2015-07-10 ENCOUNTER — Ambulatory Visit (INDEPENDENT_AMBULATORY_CARE_PROVIDER_SITE_OTHER): Payer: No Typology Code available for payment source | Admitting: Family Medicine

## 2015-07-10 ENCOUNTER — Encounter: Payer: Self-pay | Admitting: Family Medicine

## 2015-07-10 VITALS — BP 144/94 | HR 100 | Ht 69.0 in | Wt 228.4 lb

## 2015-07-10 DIAGNOSIS — E669 Obesity, unspecified: Secondary | ICD-10-CM | POA: Diagnosis not present

## 2015-07-10 DIAGNOSIS — I10 Essential (primary) hypertension: Secondary | ICD-10-CM

## 2015-07-10 DIAGNOSIS — Z0000 Encounter for general adult medical examination without abnormal findings: Secondary | ICD-10-CM | POA: Diagnosis not present

## 2015-07-10 DIAGNOSIS — E782 Mixed hyperlipidemia: Secondary | ICD-10-CM

## 2015-07-10 NOTE — Patient Instructions (Signed)
  HEALTH MAINTENANCE RECOMMENDATIONS:  It is recommended that you get at least 30 minutes of aerobic exercise at least 5 days/week (for weight loss, you may need as much as 60-90 minutes). This can be any activity that gets your heart rate up. This can be divided in 10-15 minute intervals if needed, but try and build up your endurance at least once a week.  Weight bearing exercise is also recommended twice weekly.  Eat a healthy diet with lots of vegetables, fruits and fiber.  "Colorful" foods have a lot of vitamins (ie green vegetables, tomatoes, red peppers, etc).  Limit sweet tea, regular sodas and alcoholic beverages, all of which has a lot of calories and sugar.  Up to 2 alcoholic drinks daily may be beneficial for men (unless trying to lose weight, watch sugars).  Drink a lot of water.  Sunscreen of at least SPF 30 should be used on all sun-exposed parts of the skin when outside between the hours of 10 am and 4 pm (not just when at beach or pool, but even with exercise, golf, tennis, and yard work!)  Use a sunscreen that says "broad spectrum" so it covers both UVA and UVB rays, and make sure to reapply every 1-2 hours.  Remember to change the batteries in your smoke detectors when changing your clock times in the spring and fall.  Use your seat belt every time you are in a car, and please drive safely and not be distracted with cell phones and texting while driving.   Schedule your routine eye exam, dental visits. Check with your insurance and schedule routine colonoscopy.  Continue efforts at weight loss.  Return fasting for labs.

## 2015-07-10 NOTE — Progress Notes (Signed)
Chief Complaint  Patient presents with  . Annual Exam    nonfasting annual exam. No concerns.    Eddie Harris is a 55 y.o. male who presents for a complete physical.  He has the following concerns:  Patient presents to follow up on his hypertension. It is felt that he has white coat component to his BP's, as they were very high in the office on last check in December, but fine at home. Therefore, meds were continued at just 1/2 tablet of metoprolol.  BP's at home are running 117-130's/80's, checking every other day.  BP is always high on first check, and repeat comes down 10 points. Denies headaches, dizziness, side effects.  Feels slightly dizzy ("on the verge") when he stands quickly after getting out of the car after a long drive.   Home monitor was verified as accurate.  +stress--89 yo father lives alone in [place] (and getting divorced), on oxygen, fell last night and is in the hospital, drove out to [place] to see him today, headed back after today's visit.  He previously took Lexapro for anxiety (stopped in 2015 when cost went up).  Stressors significantly worse at that time (course case, work stress). Currently denies significant symptoms other than some stress.  Snoring--intermittently. Wife couldn't really answer as to apnea. Sometimes feels unrefreshed in the mornings, mostly if he tossed and turned at night. Feels refreshed if uninterrupted sleep.  Denies any daytime somnolence.  Hyperlipidemia:  Hasn't been able to come for fasting labs (orders in system). Last lipids: Lab Results  Component Value Date   CHOL 224* 08/20/2011   HDL 51 08/20/2011   LDLCALC 148* 08/20/2011   TRIG 127 08/20/2011   CHOLHDL 4.4 08/20/2011   He cut back on Vitamin D intake from 20K IU daily to 5K daily after last labs (level was 81).    Immunization History  Administered Date(s) Administered  . Hepatitis A 08/10/2005, 05/27/2006  . Influenza Split 11/26/2012, 12/05/2013, 12/03/2014  .  Influenza, Seasonal, Injecte, Preservative Fre 02/07/2012  . PPD Test 05/14/2011, 02/07/2012, 05/11/2012, 06/27/2013, 06/18/2015  . Tdap 08/10/2005   Last colonoscopy: never (has previously seen [name] for EGD) Last PSA: 11/2014 Ophtho: years ago, maybe 3 Dentist: 4 years ago Exercise: daily--elliptical x 64minx + 30-45 mins weights (2-3 times/week). On non-weight days does treadmill or elliptical or walking outside.  Past Medical History  Diagnosis Date  . Atrial fibrillation ([place]) 2000    negative cardiac workup ([name]).  Episode occurred after heavy drinking  . Hyperlipidemia   . Anxiety   . GERD (gastroesophageal reflux disease)     silent--EGD [name]; pt denies reflux, saw duodenal irritation on EGD  . Ocular migraine     [name]  . Skin cancer, basal cell     [name]  . Paroxysmal atrial fibrillation ([facility])   . Mild obesity   . Pyloric stenosis     as an infant; vs hernia repair--both documented in his chart  . PVC (premature ventricular contraction)     Past Surgical History  Procedure Laterality Date  . Hernia repair  age 40 weeks    ?diaphragmatic given age  . Basal cell skin cancer excision      Social History   Social History  . Marital Status: Married    Spouse Name: N/A  . Number of Children: 2  . Years of Education: N/A   Occupational History  . sells medical instruments (medical supplies)  Social History Main Topics  . Smoking status: Former Smoker    Quit date: 02/23/1987  . Smokeless tobacco: Never Used  . Alcohol Use: 0.0 oz/week    0 Standard drinks or equivalent per week     Comment: 3 drinks 3 nights/week  . Drug Use: No  . Sexual Activity:    Partners: Female     Comment: infrequent   Other Topics Concern  . Not on file   Social History Narrative   Married, 2 children. Moved office back into his house (due to financial strains). Travels/drives a lot    Family History  Problem Relation Age of  Onset  . Adopted: Yes  . Asthma Son     Outpatient Encounter Prescriptions as of 07/10/2015  Medication Sig Note  . ALPHA LIPOIC ACID PO Take 1-2 tablets by mouth daily.   . Arginine POWD 1-2 scoop by Does not apply route daily.   Marland Kitchen CALCIUM & MAGNESIUM CARBONATES PO Take 1 tablet by mouth daily.     . Coenzyme Q10 10 MG capsule Take 10 mg by mouth daily.   . fish oil-omega-3 fatty acids 1000 MG capsule Take 2 g by mouth daily.    . metoprolol succinate (TOPROL-XL) 50 MG 24 hr tablet TAKE 1/2 TABLET BY MOUTH ONCE DAILY, INCREASE TO 1 TAB AFTER FEW DAYS IF NEEDED 07/10/2015: Take 25mg  daily  . vitamin C (ASCORBIC ACID) 500 MG tablet Take 500 mg by mouth daily. Reported on 07/10/2015    Facility-Administered Encounter Medications as of 07/10/2015  Medication  . cefTRIAXone (ROCEPHIN) injection 1 g    Allergies  Allergen Reactions  . Shellfish-Derived Products Anaphylaxis and Nausea And Vomiting   ROS: The patient denies anorexia, fever, headaches, vision loss, decreased hearing, ear pain, hoarseness, chest pain, palpitations, dizziness, syncope, dyspnea on exertion, cough, swelling, nausea, vomiting, diarrhea, constipation, abdominal pain, melena, hematochezia, indigestion/heartburn, hematuria, incontinence, erectile dysfunction, nocturia, weakened urine stream, dysuria, genital lesions, joint pains, numbness (see below), tingling, weakness, tremor, suspicious skin lesions, depression, abnormal bleeding/bruising, or enlarged lymph nodes Anxiety can periodically flare, overall doing fine.  Denies tachycardia. Has trouble swallowing capsules, unless he takes with a lot of food. +snoring, per HPI. Denies daytime somnolence, often feels refreshed in the mornings, if he slept well Chronic numbness right ring finger after blunt trauma/cut with knife.  PHYSICAL EXAM:  BP 170/110 mmHg  Pulse 100  Ht 5\' 9"  (1.753 m)  Wt 228 lb 6.4 oz (103.602 kg)  BMI 33.71 kg/m2  144/94 on repeat by  MD  General Appearance:   Alert, cooperative, no distress, appears stated age  Head:   Normocephalic, without obvious abnormality, atraumatic  Eyes:   PERRL, conjunctiva/corneas clear, EOM's intact, fundi   benign  Ears:   Normal TM's and external ear canals  Nose:  Nares normal, mucosa normal, no drainage or sinus tenderness  Throat:  Lips, mucosa, and tongue normal; teeth and gums normal  Neck:  Supple, no lymphadenopathy; thyroid: no enlargement/tenderness/nodules; no carotid  bruit or JVD  Back:  Spine nontender, no curvature, ROM normal, no CVA tenderness  Lungs:   Clear to auscultation bilaterally without wheezes, rales or ronchi; respirations unlabored  Chest Wall:   No tenderness or deformity  Heart:   Regular rate and rhythm, S1 and S2 normal, no murmur, rub  or gallop  Breast Exam:   No chest wall tenderness, masses or gynecomastia  Abdomen:   Soft, non-tender, nondistended, normoactive bowel sounds,   no masses, no  hepatosplenomegaly  Genitalia:   Normal male external genitalia without lesions. Testicles without masses. No inguinal hernias.  Rectal:   Normal sphincter tone, no masses or tenderness; guaiac negative stool. Prostate smooth, no nodules, not enlarged.  Extremities:  No clubbing, cyanosis or edema  Pulses:  2+ and symmetric all extremities  Skin:  Skin color, texture, turgor normal, no rashes or lesions  Lymph nodes:  Cervical, supraclavicular, and axillary nodes normal  Neurologic:  CNII-XII intact, normal strength, sensation and gait; reflexes 2+ and symmetric throughout   Psych: Normal mood, affect, hygiene and grooming       Lab Results  Component Value Date   PSA 1.21 12/19/2014   PSA 0.97 08/20/2011   Lab Results  Component Value Date   TSH 2.751 12/19/2014   Vitamin D-OH (12/19/14)  81    Chemistry      Component Value Date/Time    NA 138 12/19/2014 0001   K 4.2 12/19/2014 0001   CL 101 12/19/2014 0001   CO2 25 12/19/2014 0001   BUN 16 12/19/2014 0001   CREATININE 0.96 12/19/2014 0001      Component Value Date/Time   CALCIUM 9.4 12/19/2014 0001   ALKPHOS 43 12/19/2014 0001   AST 28 12/19/2014 0001   ALT 38 12/19/2014 0001   BILITOT 0.8 12/19/2014 0001     Glu 112 (nonfasting)  Lab Results  Component Value Date   WBC 8.8 12/19/2014   HGB 15.4 12/19/2014   HCT 44.4 12/19/2014   MCV 92.5 12/19/2014   PLT 215 12/19/2014     ASSESSMENT/PLAN:  Essential hypertension, benign - controlled per home numbers, white coat component; continue current med, regular monitoring; weight loss recommended  Obesity (BMI 30-39.9) - counseled re diet (foods, portions, etc), exercise  Mixed hyperlipidemia - return for fasting labs as ordered. lowfat, low cholesterol diet reviewed    Discussed PSA screening (risks/benefits), recommended at least 30 minutes of aerobic activity at least 5 days/week weight-bearing exercise at least 2x/wk; proper sunscreen use reviewed; healthy diet and alcohol recommendations (less than or equal to 2 drinks/day) reviewed; regular seatbelt use; changing batteries in smoke detectors. Self-testicular exams. Immunization recommendations discussed--UTD, continue yearly flu shots. Colonoscopy recommendations reviewed--recommended. He will call to schedule routine screening colonoscopy --to check with his insurance to see where is affordable for him to go (discussed Dr. Carlean Purl, his friend, [name] whom he has seen before).  Consider sleep study. Work on losing weight, and get dental, ophtho and colonoscopy appts scheduled prior to considering sleep study.

## 2015-07-13 ENCOUNTER — Other Ambulatory Visit: Payer: Self-pay | Admitting: Family Medicine

## 2015-08-07 ENCOUNTER — Telehealth: Payer: Self-pay

## 2015-08-07 NOTE — Telephone Encounter (Signed)
He is due for tetanus booster. Truly just needs Td (since he had Tdap 07/2005).  Okay for TdaP. Okay to schedule nurse visit

## 2015-08-07 NOTE — Telephone Encounter (Signed)
Pt called the office to see if he can have a TDaP. He needs this for work, ok to schedule? Pt states he can be reached back at [phone]. Thank you, Wells Guiles

## 2015-08-08 NOTE — Telephone Encounter (Signed)
LMTCB

## 2015-08-13 NOTE — Telephone Encounter (Signed)
Spoke with pt- he is made aware that he can have tdap and appt scheduled. Victorino December

## 2015-08-18 ENCOUNTER — Ambulatory Visit (INDEPENDENT_AMBULATORY_CARE_PROVIDER_SITE_OTHER): Payer: No Typology Code available for payment source | Admitting: Family Medicine

## 2015-08-18 ENCOUNTER — Other Ambulatory Visit: Payer: No Typology Code available for payment source

## 2015-08-18 ENCOUNTER — Encounter: Payer: Self-pay | Admitting: Family Medicine

## 2015-08-18 VITALS — BP 140/90 | HR 92 | Ht 69.0 in | Wt 223.2 lb

## 2015-08-18 DIAGNOSIS — R7301 Impaired fasting glucose: Secondary | ICD-10-CM | POA: Diagnosis not present

## 2015-08-18 DIAGNOSIS — Z23 Encounter for immunization: Secondary | ICD-10-CM

## 2015-08-18 DIAGNOSIS — R1013 Epigastric pain: Secondary | ICD-10-CM | POA: Diagnosis not present

## 2015-08-18 DIAGNOSIS — F4321 Adjustment disorder with depressed mood: Secondary | ICD-10-CM

## 2015-08-18 DIAGNOSIS — R030 Elevated blood-pressure reading, without diagnosis of hypertension: Secondary | ICD-10-CM

## 2015-08-18 DIAGNOSIS — IMO0001 Reserved for inherently not codable concepts without codable children: Secondary | ICD-10-CM

## 2015-08-18 LAB — POCT GLYCOSYLATED HEMOGLOBIN (HGB A1C): HEMOGLOBIN A1C: 5.7

## 2015-08-18 NOTE — Patient Instructions (Signed)
  Consider Grief counseling (available through Hospice amongst other places) if ongoing problems with grief related to the recent death of your uncle. Call the office if your anxiety isn't improving, and you feel that you need to restart the lexapro.  Start taking Prilosec 20mg  (OTC) once daily, before breakfast.  If this doesn't help your stomach symptoms adequately, increased to 2 capsules (40mg ) and call for a prescription of 40mg  omeprazole.  Take this PPI once daily for at least 2 weeks, up to a full month regularly if symptoms still require it. Avoid all aspirin and anti-inflammatories.  Use tylenol for pain, if needed. Continue to avoid alcohol, limit caffine and avoid acidic/spicy foods (see handout). Be sure to eat no less than 1200 calories daily (or your metabolism will slow down)--you probably should be at least 1500 calories daily.  Continue lowfat diet, regular exercise, and weight loss. Return fasting for your cholesterol panel.  Food Choices for Gastroesophageal Reflux Disease, Adult When you have gastroesophageal reflux disease (GERD), the foods you eat and your eating habits are very important. Choosing the right foods can help ease the discomfort of GERD. WHAT GENERAL GUIDELINES DO I NEED TO FOLLOW?  Choose fruits, vegetables, whole grains, low-fat dairy products, and low-fat meat, fish, and poultry.  Limit fats such as oils, salad dressings, butter, nuts, and avocado.  Keep a food diary to identify foods that cause symptoms.  Avoid foods that cause reflux. These may be different for different people.  Eat frequent small meals instead of three large meals each day.  Eat your meals slowly, in a relaxed setting.  Limit fried foods.  Cook foods using methods other than frying.  Avoid drinking alcohol.  Avoid drinking large amounts of liquids with your meals.  Avoid bending over or lying down until 2-3 hours after eating. WHAT FOODS ARE NOT RECOMMENDED? The  following are some foods and drinks that may worsen your symptoms: Vegetables Tomatoes. Tomato juice. Tomato and spaghetti sauce. Chili peppers. Onion and garlic. Horseradish. Fruits Oranges, grapefruit, and lemon (fruit and juice). Meats High-fat meats, fish, and poultry. This includes hot dogs, ribs, ham, sausage, salami, and bacon. Dairy Whole milk and chocolate milk. Sour cream. Cream. Butter. Ice cream. Cream cheese.  Beverages Coffee and tea, with or without caffeine. Carbonated beverages or energy drinks. Condiments Hot sauce. Barbecue sauce.  Sweets/Desserts Chocolate and cocoa. Donuts. Peppermint and spearmint. Fats and Oils High-fat foods, including [place] fries and potato chips. Other Vinegar. Strong spices, such as black pepper, white pepper, red pepper, cayenne, curry powder, cloves, ginger, and chili powder. The items listed above may not be a complete list of foods and beverages to avoid. Contact your dietitian for more information.   This information is not intended to replace advice given to you by your health care provider. Make sure you discuss any questions you have with your health care provider.   Document Released: 02/08/2005 Document Revised: 03/01/2014 Document Reviewed: 12/13/2012 Elsevier Interactive Patient Education [employer].

## 2015-08-18 NOTE — Progress Notes (Signed)
Chief Complaint  Patient presents with  . Advice Only    has had a "nervous stomach" and loss of appetite over the last 3 weeks. Cut out all alcohol over the last 2 weeks. Also had a death in the family. Feels like this is anxiety.   . Vaccine due    tdap last was 07/2005.   His job requires Tdap.  Last TdaP was 10 years ago.  Since his physical last month, he made some changes in his diet/behavior, as recommended.  He gave up alcohol.  He stopped snacking at night (which he used to do after drinking alcohol).  He continues to get regular exercise.  He lost weight, but became concerned about his health after his uncle died suddenly on Father's Day.  He googled weight loss (even though there were good reasons for the loss) and became very concerned. He has had a lot of anxiety related to the loss, as well as worrying about his health.   He is down 5.2# from CPE last month.  Some days he has a nervous stomach--minor nausea, "gnawing" feeling in his stomach. Denies any diarrhea or change in bowel habits.  "Anxious stomach"--butterflies in stomach, loss of appetite. He feels a little better now that he isn't eating before bedtime. Denies excessive thirst.  Drinks a lot of water.  After giving up alcohol, he had a day where he felt like he was voiding a lot, resolved.  Stomach is worse on an empty stomach, a little better after eating. He tried Zantac 75, 1-2 at a time, sometimes up to twice daily, and this helped.  He used this sporadically, not regularly. Worse after coffee, not as bad after black tea.  He made some additional changes, besides stopping alcohol-- He changed probiotic to one without as many ingredients. Stopped taking turmeric supplement. Last week, when stomach had been upset and decreased appetite-- had protein shake and banana, small lunch, small dinner--felt like he was getting <1000 calories/day.  He has been checking his BP's daily, sometimes in the morning, other times in  the evening.  112/80 at home this morning (shortly after exercising on his rebounder). Usually 120's-133/78-84.  Evenings/afternoon ranging 112-125/76-87.  He bought a glucose monitor and has been checking blood sugars.  Fasting sugars were checked 3x, 97, 120 (repeat a few minutes later was 113) and 106.    Other times of the day (not fasting, maybe 2-3 hours after eating) 102-121. He is not fasting today for the labs that were recommended at his physical last week.  Dad's youngest brother (uncle) died suddenly in a boating accident (event--heart attack, fell out of canoe) on Father's Day.  Drove to [place] to tell his dad about his brother.  This has created a lot of worry/anxiety.  Concerned about his back, saw chiropractor.  He continues to work out, thinks it is muscular.  Some flank discomfort persists (in lat region).  PMH, PSH, SH and FH was reviewed/updated  Outpatient Encounter Prescriptions as of 08/18/2015  Medication Sig  . ALPHA LIPOIC ACID PO Take 1-2 tablets by mouth daily.  . Arginine POWD 1-2 scoop by Does not apply route daily.  Marland Kitchen CALCIUM & MAGNESIUM CARBONATES PO Take 1 tablet by mouth daily.    . Coenzyme Q10 10 MG capsule Take 10 mg by mouth daily.  . fish oil-omega-3 fatty acids 1000 MG capsule Take 2 g by mouth daily.   . Lactobacillus (ACIDOPHILUS PO) Take 1 capsule by mouth at bedtime.  Marland Kitchen  metoprolol succinate (TOPROL-XL) 50 MG 24 hr tablet TAKE 1/2 TABLET BY MOUTH ONCE DAILY, INCREASE TO 1 TAB AFTER FEW DAYS IF NEEDED  . vitamin C (ASCORBIC ACID) 500 MG tablet Take 500 mg by mouth daily. Reported on 07/10/2015   Facility-Administered Encounter Medications as of 08/18/2015  Medication  . cefTRIAXone (ROCEPHIN) injection 1 g   Allergies  Allergen Reactions  . Shellfish-Derived Products Anaphylaxis and Nausea And Vomiting   ROS: no fever, chills, headaches, dizziness, chest pain, shortness of breath.  GI complaints as per HPI. Denies bowel changes. No urinary  complaints, bleeding, bruising, rash.  +flank/lat pain as per HPI.  +anxiety, denies depression  PHYSICAL EXAM: BP 140/90 mmHg  Pulse 92  Ht 5\' 9"  (1.753 m)  Wt 223 lb 3.2 oz (101.243 kg)  BMI 32.95 kg/m2  Anxious, talkative male in no distress HEENT: PERRL, EOMI, conjunctiva and sclera clear. OP is clear Neck: no lymphadenopathy or mass, no bruit Heart: regular rate and rhythm without murmur Lungs: clear bilaterally Back: no CVA tenderness Abdomen: minimal epigastric tenderness, no rebound or guarding, no mass, no organomegaly. Normal bowel sound Extremities: no edema Neuro: alert and oriented, cranial nerves intact, normal strength, gait Psych: anxious, full range of affect, normal speech, eye contact, hygiene and grooming   Lab Results  Component Value Date   HGBA1C 5.7 08/18/2015    ASSESSMENT/PLAN:  IFG (impaired fasting glucose) - borderline. Check sugar LESS often (creating anxiety); continue weight loss, exercise, limiting carbs/sugar - Plan: HgB A1c  Immunization due - Plan: Tdap vaccine greater than or equal to 7yo IM  Abdominal pain, epigastric - Ddx reviewed, gastritis vs PUD vs GERD. PPI vs H2 blocker and diet/behavioral recommendations discussed  Elevated blood pressure - contributed by anxiety.  Better at home. monitor periodically. Continue weight loss, exercise; low sodium diet  Grief reaction - and anxiety; encouraged counseling. If persistent/worsening anxiety, restart Lexapro   TdaP today  Discussed lexapro and risks/side effects/how to start so that if he calls with ongoing anxiety, we can call it in without OV to discuss.   Over 30 minute visit, more than 1/2 spent counseling re: grief, diet, weight loss, GERD precautions, reassuring patient.    Consider Grief counseling (available through Hospice amongst other places) if ongoing problems with grief related to the recent death of your uncle.  Start taking Prilosec 20mg  (OTC) once daily, before  breakfast.  If this doesn't help your stomach symptoms adequately, increased to 2 capsules (40mg ) and call for a prescription of 40mg  omeprazole.  Take this PPI once daily for at least 2 weeks, up to a full month regularly if symptoms still require it. Avoid all aspirin and anti-inflammatories.  Use tylenol for pain, if needed. Continue to avoid alcohol, limit caffine and avoid acidic/spicy foods (see handout). Be sure to eat no less than 1200 calories daily (or your metabolism will slow down)--you probably should be at least 1500 calories daily.

## 2015-08-19 ENCOUNTER — Encounter: Payer: Self-pay | Admitting: Family Medicine

## 2015-08-20 ENCOUNTER — Other Ambulatory Visit: Payer: No Typology Code available for payment source

## 2015-08-20 ENCOUNTER — Encounter: Payer: Self-pay | Admitting: Family Medicine

## 2015-08-20 DIAGNOSIS — F411 Generalized anxiety disorder: Secondary | ICD-10-CM

## 2015-08-20 DIAGNOSIS — Z131 Encounter for screening for diabetes mellitus: Secondary | ICD-10-CM

## 2015-08-20 DIAGNOSIS — E7800 Pure hypercholesterolemia, unspecified: Secondary | ICD-10-CM

## 2015-08-20 MED ORDER — ESCITALOPRAM OXALATE 10 MG PO TABS: 10.0000 mg | ORAL_TABLET | Freq: Every day | ORAL | Status: DC

## 2015-08-21 LAB — GLUCOSE, RANDOM: Glucose, Bld: 96 mg/dL (ref 65–99)

## 2015-08-21 LAB — LIPID PANEL
CHOL/HDL RATIO: 4.3 ratio (ref <=5.0)
CHOLESTEROL: 249 mg/dL — AB (ref 125–200)
HDL: 58 mg/dL (ref >=40)
LDL Cholesterol: 155 mg/dL — ABNORMAL HIGH (ref <130)
Triglycerides: 178 mg/dL — ABNORMAL HIGH (ref <150)
VLDL: 36 mg/dL — ABNORMAL HIGH (ref <30)

## 2015-09-05 ENCOUNTER — Encounter: Payer: Self-pay | Admitting: Family Medicine

## 2016-01-19 ENCOUNTER — Ambulatory Visit (INDEPENDENT_AMBULATORY_CARE_PROVIDER_SITE_OTHER): Payer: 59 | Admitting: Family Medicine

## 2016-01-19 ENCOUNTER — Encounter: Payer: Self-pay | Admitting: Family Medicine

## 2016-01-19 VITALS — BP 132/80 | HR 92 | Temp 98.3°F | Resp 16 | Wt 233.4 lb

## 2016-01-19 DIAGNOSIS — R21 Rash and other nonspecific skin eruption: Secondary | ICD-10-CM | POA: Diagnosis not present

## 2016-01-19 MED ORDER — DOXYCYCLINE HYCLATE 100 MG PO TABS: 100.0000 mg | ORAL_TABLET | Freq: Two times a day (BID) | ORAL | 0 refills | Status: DC

## 2016-01-19 NOTE — Progress Notes (Signed)
   Subjective:    Patient ID: Eddie Harris, male    DOB: 11/19/1960, 55 y.o.   MRN: [id number]  HPI Chief Complaint  Patient presents with  . Rash    in armpits, groin, behind knee.   He is here with complaints of a red, raised, pruritic rash to his inner thighs for 2 weeks. States he then developed a red raised pruritic rash to his waistline, bilateral posterior knees, axilla areas and antecubital areas. States he has been using hydrocortisone for several days without any relief.  Denies new detergents, lotions, soaps.  Denies fever, chills, nausea, vomiting, diarrhea.    Review of Systems Pertinent positives and negatives in the history of present illness.     Objective:   Physical Exam BP 132/80   Pulse 92   Temp 98.3 F (36.8 C)   Resp 16   Wt 233 lb 6.4 oz (105.9 kg)   SpO2 97%   BMI 34.47 kg/m  Patches of small, red bumps to bilateral axilla, antecubital spaces, Anterior waistline, bilateral inner thighs and posterior knees. No erythema, induration, drainage or signs of infection.      Assessment & Plan:  Rash and nonspecific skin eruption  Plan to treat him with oral Abx due to multiple areas with rash. Suspicious for folliculitis but discussed that the etiology is not clear. Rash is not likely scabies. Dr. Redmond School in agreement with plan.  If rash does not clear up then I recommend labs to look for underlying etiology such as diabetes. His last A1C  5.7% was June 2017.  He will follow up as needed.

## 2016-01-21 ENCOUNTER — Encounter: Payer: Self-pay | Admitting: Family Medicine

## 2016-02-14 ENCOUNTER — Other Ambulatory Visit: Payer: Self-pay | Admitting: Family Medicine

## 2016-02-14 DIAGNOSIS — F411 Generalized anxiety disorder: Secondary | ICD-10-CM

## 2016-02-14 NOTE — Telephone Encounter (Signed)
He is due for a med check--he can either come fasting, or do labs in advance--and enter orders for A1c (IFG), lipids (mixed hyperlipidemia), c-met (HTN, med monitor).  He is also past due for CPE, last done 11/2014, and should schedule for next available (but needs med check in January).  I refilled lexapro, but please contact pt and schedule these visits. Thanks

## 2016-02-17 ENCOUNTER — Other Ambulatory Visit: Payer: Self-pay | Admitting: Family Medicine

## 2016-02-17 DIAGNOSIS — Z5181 Encounter for therapeutic drug level monitoring: Secondary | ICD-10-CM

## 2016-02-17 DIAGNOSIS — E782 Mixed hyperlipidemia: Secondary | ICD-10-CM

## 2016-02-17 DIAGNOSIS — R7301 Impaired fasting glucose: Secondary | ICD-10-CM

## 2016-02-17 DIAGNOSIS — I10 Essential (primary) hypertension: Secondary | ICD-10-CM

## 2016-02-17 NOTE — Telephone Encounter (Signed)
Called and left a detailed message on Vm that he is due for med check in January and can come in for fasting labs at appt or in advance. I have put in future orders and I also told him to schedule cpe as he was due back in October and needs to get something scheduled.

## 2016-02-26 NOTE — Telephone Encounter (Signed)
Appt made for med ck and CPE, pt does not want to do labs at this time

## 2016-03-20 ENCOUNTER — Other Ambulatory Visit: Payer: Self-pay | Admitting: Family Medicine

## 2016-03-20 DIAGNOSIS — F411 Generalized anxiety disorder: Secondary | ICD-10-CM

## 2016-03-23 NOTE — Progress Notes (Signed)
Chief Complaint  Patient presents with  . Hypertension    nonfasting med check.    Patient presents to f/u on impaired fasting glucose, elevated BP's, epigastric pain, as discussed at his last visit with me in June. c-met, lipid and A1c were due in December, to be done prior to his visit, but he did not have labs done yet.  HTN--he was started on metoprolol in May.  He is taking 25 mg (1/2 tablet of 26m) once daily.  BP's at home are running 118-140/77-89, checking every other day (didn't bring list).  Denies side effects. He reports we verified his monitor as accurate in the past.  Anxiety--taking lexapro.  Was agitated regarding the care his dad had in the nursing facility. Currently he reports his anxiety is working, denies side effects.  [facility]  Most of his grieving occurred during the hospitalization, doing okay now.   Weight got down to 215# but [place]  IFG-- Lost weight quickly when he gave up alcohol Currently drinks some alcohol on the weekends, some during dad's illness (had lost weight when stopped entirely) Still avoids sugar. Snacks on pretzels Makes his own bone broth--feels like this helps his joint pains.  Mixed hyperlipidemia--diet had improved. Due for recheck. He isn't fasting today.  Lab Results  Component Value Date   CHOL 249 (H) 08/20/2015   HDL 58 08/20/2015   LDLCALC 155 (H) 08/20/2015   TRIG 178 (H) 08/20/2015   CHOLHDL 4.3 08/20/2015   epiastric pain/GERD--had symptoms for about a week after his last visit in June, but resolved. He took prilosec as directed, no longer needing.  Left posterior thigh had a knot that got red, sore.  He used antibiotic ointment. He describes that it felt hard on the outside, but soft in the center.  It never drained as far as he knows, but now is less tender.  Had been using warm compresses.  Would like this looked at today  [place]  Outpatient Encounter Prescriptions as of 03/24/2016  Medication Sig Note  . ALPHA LIPOIC ACID PO Take 1-2 tablets by mouth daily.   . Arginine POWD 1-2 scoop by Does not apply route daily.   .Marland KitchenCALCIUM & MAGNESIUM CARBONATES PO Take 1 tablet by mouth daily.     . Coenzyme Q10 10 MG capsule Take 10 mg by mouth daily.   .Marland Kitchenescitalopram (LEXAPRO) 10 MG tablet Take 1 tablet (10 mg total) by mouth daily.   . fish oil-omega-3 fatty acids 1000 MG capsule Take 2 g by mouth daily.  03/24/2016: Total of 12045mdaily  . Lactobacillus (ACIDOPHILUS PO) Take 1 capsule by mouth at bedtime.   . metoprolol succinate (TOPROL-XL) 50 MG 24 hr tablet TAKE 1/2 TABLET BY MOUTH ONCE DAILY, INCREASE TO 1 TAB AFTER FEW DAYS IF NEEDED (Patient taking differently: take 1/2 tablet daily)   . vitamin C (ASCORBIC ACID) 500 MG tablet Take 500 mg by mouth daily. Reported on 07/10/2015   . [DISCONTINUED] escitalopram (LEXAPRO) 10 MG tablet TAKE 1 TABLET (10 MG TOTAL) BY MOUTH DAILY.   . [DISCONTINUED] doxycycline (VIBRA-TABS) 100 MG tablet Take 1 tablet (100 mg total) by mouth 2 (two) times daily.    Facility-Administered Encounter Medications as of 03/24/2016  Medication  . cefTRIAXone (ROCEPHIN) injection 1 g   Allergies  Allergen Reactions  . Shellfish-Derived Products Anaphylaxis and Nausea And Vomiting  ROS: Denies fever, chills, URI symptoms, nausea, vomiting, diarrhea, abdominal pain, urinary complaints, no ED or libido concerns. Moods are good. Knee pain is improved since having bone broth. No chest pain, shortness of breath, edema, bleeding, bruising, or rash other than the thigh lesion.  See HPI.   PHYSICAL EXAM:  BP (!) 166/102 (BP Location: Left Arm, Patient Position: Sitting, Cuff Size: Normal)   Pulse 96   Ht _0  (1.753 m)   Wt 235 lb (106.6 kg)   BMI 34.70 kg/m   154/102 on repeat by MD, pulse in mid-high 90's.  Somewhat excitable male, in no  distress, in good spirits HEENT: conjunctiva and sclera are clear, OP is clear Neck: no lymphadenopathy, thyromegaly or carotid bruit Heart: regular rate and rhythm, no murmur Lungs: clear bilaterally Back: no spinal or CVA tenderness Extremities: no edema, + pulses Skin: 2-33m open area on top of slightly firm, hyperpigmented area at left posterior mid-thigh. Minimal induration, no fluctuance. No erythema or warmth Neuro: alert and oriented, cranial nerves intact, normal strength, gait Psych: normal mood, affect, hygiene and grooming.  Lab Results  Component Value Date   HGBA1C 5.5 03/24/2016     ASSESSMENT/PLAN:  IFG (impaired fasting glucose) - A1c improved; regained weight he had lost--encouraged proper diet, weight loss, daily exercise - Plan: HgB A1c  Generalized anxiety disorder - controlled with lexapro - Plan: escitalopram (LEXAPRO) 10 MG tablet  Essential hypertension, benign - elevated today, reportedly normal at home; suspect white coat component (HR elevated also); Continue monitor, low sodium diet, weight loss  Mixed hyperlipidemia - due for recheck; continue lowfat diet  Boil of leg except foot - appears to have drained spontaneously, and is healing; no ongoing abscess/infection; antibacterial cream until healed.   Continue to apply antibiotic ointment to the sore on the left thigh until it has healed.  Return for evaluation if it increases in size, pain, tenderness, redness, any streaks or fever develops. Currently it appears that it is healing, and no further evidence of any abscess or infection.  Return fasting for c-met, lipid as already ordered for December. Will need PSA, Hep C and CBC, TSH and poss other labs at physical in July, depending on lab results.  F/u/ at scheduled in July for CPE

## 2016-03-24 ENCOUNTER — Ambulatory Visit (INDEPENDENT_AMBULATORY_CARE_PROVIDER_SITE_OTHER): Payer: BLUE CROSS/BLUE SHIELD | Admitting: Family Medicine

## 2016-03-24 ENCOUNTER — Encounter: Payer: Self-pay | Admitting: Family Medicine

## 2016-03-24 VITALS — BP 166/102 | HR 96 | Ht 69.0 in | Wt 235.0 lb

## 2016-03-24 DIAGNOSIS — L02429 Furuncle of limb, unspecified: Secondary | ICD-10-CM | POA: Diagnosis not present

## 2016-03-24 DIAGNOSIS — E782 Mixed hyperlipidemia: Secondary | ICD-10-CM

## 2016-03-24 DIAGNOSIS — F411 Generalized anxiety disorder: Secondary | ICD-10-CM

## 2016-03-24 DIAGNOSIS — R7301 Impaired fasting glucose: Secondary | ICD-10-CM | POA: Diagnosis not present

## 2016-03-24 DIAGNOSIS — I10 Essential (primary) hypertension: Secondary | ICD-10-CM | POA: Diagnosis not present

## 2016-03-24 LAB — POCT GLYCOSYLATED HEMOGLOBIN (HGB A1C): Hemoglobin A1C: 5.5

## 2016-03-24 MED ORDER — ESCITALOPRAM OXALATE 10 MG PO TABS: 10.0000 mg | ORAL_TABLET | Freq: Every day | ORAL | 5 refills | Status: DC

## 2016-03-24 NOTE — Patient Instructions (Signed)
  Continue to apply antibiotic ointment to the sore on the left thigh until it has healed.  Return for evaluation if it increases in size, pain, tenderness, redness, any streaks or fever develops. Currently it appears that it is healing, and no further evidence of any abscess or infection.  Continue to get regular exercise, work on weight loss, follow low sodium diet. At your convenience, e-mail [place] a list of your recent blood pressures.  Return in a month for fasting labs (this will check liver, kidney, sugar and cholesterol panel).

## 2016-04-15 ENCOUNTER — Ambulatory Visit: Payer: BLUE CROSS/BLUE SHIELD | Admitting: Family Medicine

## 2016-06-21 ENCOUNTER — Other Ambulatory Visit (INDEPENDENT_AMBULATORY_CARE_PROVIDER_SITE_OTHER): Payer: BLUE CROSS/BLUE SHIELD

## 2016-06-21 DIAGNOSIS — Z111 Encounter for screening for respiratory tuberculosis: Secondary | ICD-10-CM

## 2016-06-24 LAB — TB SKIN TEST: TB SKIN TEST: NEGATIVE

## 2016-07-08 ENCOUNTER — Telehealth: Payer: Self-pay

## 2016-07-08 MED ORDER — METOPROLOL SUCCINATE ER 50 MG PO TB24: ORAL_TABLET | ORAL | 1 refills | Status: DC

## 2016-07-08 NOTE — Telephone Encounter (Signed)
Pt needs refill of metoprolol [facility]. Eddie Harris

## 2016-07-08 NOTE — Telephone Encounter (Signed)
Done

## 2016-07-30 ENCOUNTER — Encounter: Payer: Self-pay | Admitting: Family Medicine

## 2016-07-31 ENCOUNTER — Encounter: Payer: Self-pay | Admitting: Family Medicine

## 2016-09-15 NOTE — Progress Notes (Deleted)
Eddie Harris is a 56 y.o. male who presents for a complete physical.  He has the following concerns:   He was last seen in January for f/u on blood pressure.  He wasn't fasting at that time, and was asked to return for fasting labs (c-met and lipids), but never did.  HTN--he was started on metoprolol in May 2017.  He is taking 25 mg (1/2 tablet of '50mg'$ ) once daily.  BP's at home are running 118-140/77-89, checking every other day (didn't bring list).  Denies side effects. He reports we verified his monitor as accurate in the past.  Anxiety--taking lexapro, working well to control anxiety; denies side effects.  [facility] with pneumonia, sepsis).  Weight got down to 215# but regained during frequent travel to [place] related to his dad.  IFG-- Lost weight quickly when he gave up alcohol. He regained the weight when he restarted some drinking on the weekends, and related to dad's hospitalization (change in eating habits also). Currently drinks some alcohol on the weekends Still avoids sugar. Snacks on pretzels Makes his own bone broth--feels like this helps his joint pains.  Mixed hyperlipidemia: He never returned for fasting labs.  He tries to follow lowfat, low cholesterol diet. Lab Results  Component Value Date   CHOL 249 (H) 08/20/2015   HDL 58 08/20/2015   LDLCALC 155 (H) 08/20/2015   TRIG 178 (H) 08/20/2015   CHOLHDL 4.3 08/20/2015    Immunization History  Administered Date(s) Administered  . Hepatitis A 08/10/2005, 05/27/2006  . Influenza Split 11/26/2012, 12/05/2013, 12/03/2014  . Influenza, Seasonal, Injecte, Preservative Fre 02/07/2012  . Influenza-Unspecified 03/14/2016  . PPD Test 05/14/2011, 02/07/2012, 05/11/2012, 06/27/2013, 06/18/2015, 06/21/2016  . Tdap 08/10/2005, 08/18/2015   Last colonoscopy: never (has previously seen [name] for EGD) Last PSA: 11/2014 Ophtho:  Dentist:  Exercise:  treadmill,  elliptical weights, almost daily.    ROS:  The patient denies anorexia, fever, headaches, vision loss, decreased hearing, ear pain, hoarseness, chest pain, palpitations, dizziness, syncope, dyspnea on exertion, cough, swelling, nausea, vomiting, diarrhea, constipation, abdominal pain, melena, hematochezia, indigestion/heartburn, hematuria, incontinence, erectile dysfunction, nocturia, weakened urine stream, dysuria, genital lesions, joint pains, numbness, tingling, weakness, tremor, suspicious skin lesions, depression, abnormal bleeding/bruising, or enlarged lymph nodes Some weight gain Occasional PVC.  Denies tachycardia. Has trouble swallowing capsules, unless he takes with a lot of food. +snoring.  Not aware of any apnea.  Denies daytime somnolence, often feels refreshed in the mornings, if he slept well  PHYSICAL EXAM:  Wt Readings from Last 3 Encounters:  03/24/16 235 lb (106.6 kg)  01/19/16 233 lb 6.4 oz (105.9 kg)  08/18/15 223 lb 3.2 oz (101.2 kg)   General Appearance:    Alert, cooperative, no distress, appears stated age  Head:    Normocephalic, without obvious abnormality, atraumatic  Eyes:    PERRL, conjunctiva/corneas clear, EOM's intact, fundi    benign  Ears:    Normal TM's and external ear canals  Nose:   Nares normal, mucosa normal, no drainage or sinus   tenderness  Throat:   Lips, mucosa, and tongue normal; teeth and gums normal  Neck:   Supple, no lymphadenopathy;  thyroid:  no   enlargement/tenderness/nodules; no carotid bruit or JVD  Back:    Spine nontender, no curvature, ROM normal, no CVA     tenderness  Lungs:     Clear to auscultation bilaterally without wheezes, rales or     ronchi; respirations  unlabored  Chest Wall:    No tenderness or deformity   Heart:    Regular rate and rhythm, S1 and S2 normal, no murmur, rub   or gallop  Breast Exam:    No chest wall tenderness, masses or gynecomastia  Abdomen:     Soft, non-tender, nondistended, normoactive bowel  sounds,    no masses, no hepatosplenomegaly  Genitalia:    Normal male external genitalia without lesions.  Testicles without masses.  No inguinal hernias.  Rectal:    Normal sphincter tone, no masses or tenderness; guaiac negative stool.  Prostate smooth, no nodules, not enlarged.  Extremities:   No clubbing, cyanosis or edema  Pulses:   2+ and symmetric all extremities  Skin:   Skin color, texture, turgor normal, no rashes or lesions  Lymph nodes:   Cervical, supraclavicular, and axillary nodes normal  Neurologic:   CNII-XII intact, normal strength, sensation and gait; reflexes 2+ and symmetric throughout                                Psych:   Normal mood, affect, hygiene and grooming.   ASSESSMENT/PLAN:   c-met, lipids, HepC, PSA, TSH, A1c  Shingrix when available  Discussed PSA screening (risks/benefits), recommended at least 30 minutes of aerobic activity at least 5 days/week; proper sunscreen use reviewed; healthy diet and alcohol recommendations (less than or equal to 2 drinks/day) reviewed; regular seatbelt use; changing batteries in smoke detectors. Self-testicular exams. Immunization recommendations discussed--UTD.  Colonoscopy recommendations reviewed--recommended.

## 2016-09-16 ENCOUNTER — Encounter: Payer: 59 | Admitting: Family Medicine

## 2016-09-20 ENCOUNTER — Telehealth: Payer: Self-pay | Admitting: Family Medicine

## 2016-09-20 MED ORDER — METOPROLOL SUCCINATE ER 50 MG PO TB24: ORAL_TABLET | ORAL | 0 refills | Status: DC

## 2016-09-20 NOTE — Telephone Encounter (Signed)
Done

## 2016-09-20 NOTE — Telephone Encounter (Signed)
[facility] Met[facility]rolol 50mg  tab

## 2016-09-24 ENCOUNTER — Other Ambulatory Visit: Payer: Self-pay | Admitting: Family Medicine

## 2016-09-24 DIAGNOSIS — F411 Generalized anxiety disorder: Secondary | ICD-10-CM

## 2016-09-24 NOTE — Telephone Encounter (Signed)
V I called pt and talked with him and he had to cancel CPE due to death in family I told him I would have you call him to get him an appointment he was a little upset but I sent in 30 days with no refills per Tomi Bamberger

## 2016-09-24 NOTE — Telephone Encounter (Signed)
He canceled his physical last week and hasn't rescheduled.   Okay to refill #30, no refills.  He needs to get on the books for a physical, and get on the cancellation list or else he will need to schedule a med check within the next 2 weeks.  He was last seen 6 months ago and due for med check (vs CPE)

## 2016-09-24 NOTE — Telephone Encounter (Signed)
Is this okay to refill? 

## 2016-10-21 ENCOUNTER — Encounter: Payer: Self-pay | Admitting: Family Medicine

## 2016-10-21 ENCOUNTER — Ambulatory Visit (INDEPENDENT_AMBULATORY_CARE_PROVIDER_SITE_OTHER): Payer: BLUE CROSS/BLUE SHIELD | Admitting: Family Medicine

## 2016-10-21 VITALS — BP 140/94 | HR 100 | Ht 69.0 in | Wt 233.8 lb

## 2016-10-21 DIAGNOSIS — N481 Balanitis: Secondary | ICD-10-CM

## 2016-10-21 DIAGNOSIS — I10 Essential (primary) hypertension: Secondary | ICD-10-CM

## 2016-10-21 DIAGNOSIS — R7301 Impaired fasting glucose: Secondary | ICD-10-CM

## 2016-10-21 NOTE — Patient Instructions (Signed)
I suspect this is a mild balanitis, that you have partially treated with the clotrimazole you have been using.  It is recommended to use it twice daily to the affected area for up to 2 weeks (at least 7-10 days). Keep the area clean and dry. I don't think you need any antibacterial ointment (only if any oozing, crusting develops).   Balanitis Balanitis is swelling and irritation (inflammation) of the head of the penis (glans penis). The condition may also cause inflammation of the skin around the glans penis (foreskin) in men who have not been circumcised. It may develop because of an infection or another medical condition. Balanitis occurs most often among men who have not had their foreskin removed (uncircumcised men). Balanitis sometimes causes scarring of the penis or foreskin, which can require surgery. Untreated balanitis can increase the risk of penile cancer. What are the causes? Common causes of this condition include:  Poor personal hygiene, especially in uncircumcised men. Not cleaning the glans penis and foreskin well can result in buildup of bacteria, viruses, and yeast, which can lead to infection and inflammation.  Irritation and lack of air flow due to fluid (smegma) that can build up on the glans penis.  Other causes include:  Chemical irritation from products such as soaps or shower gels (especially those that have fragrance), condoms, personal lubricants, petroleum jelly, spermicides, or fabric softeners.  Skin conditions, such as eczema, dermatitis, and psoriasis.  Allergies to medicines, such as tetracycline and sulfa drugs.  Certain medical conditions, including liver cirrhosis, congestive heart failure, diabetes, and kidney disease.  Infections, such as candidiasis, HPV (human papillomavirus), herpes simplex, gonorrhea, and syphilis.  Severe obesity.  What increases the risk? The following factors may make you more likely to develop this condition:  Having  diabetes. This is the most common risk factor.  Having a tight foreskin that is difficult to pull back (retract) past the glans.  Having sexual intercourse without using a condom.  What are the signs or symptoms? Symptoms of this condition include:  Discharge from under the foreskin.  A bad smell.  Pain or difficulty retracting the foreskin.  Tenderness, redness, and swelling of the glans.  A rash or sores on the glans or foreskin.  Itchiness.  Inability to get an erection due to pain.  Difficulty urinating.  Scarring of the penis or foreskin, in some cases.  How is this diagnosed? This condition may be diagnosed based on:  A physical exam.  Testing a swab of discharge to check for bacterial or fungal infection.  Blood tests: ? To check for viruses that can cause balanitis. ? To check your blood sugar (glucose) level. High blood glucose could be a sign of diabetes, which can cause balanitis.  How is this treated? Treatment for balanitis depends on the cause. Treatment may include:  Improving personal hygiene. Your health care provider may recommend sitting in a bath of warm water that is deep enough to cover your hips and buttocks (sitz bath).  Medicines such as: ? Creams or ointments to reduce swelling (steroids) or to treat an infection. ? Antibiotic medicine. ? Antifungal medicine.  Surgery to remove or cut the foreskin (circumcision). This may be done if you have scarring on the foreskin that makes it difficult to retract.  Controlling other medical problems that may be causing your condition or making it worse.  Follow these instructions at home:  Do not have sex until the condition clears up, or until your health care provider  approves.  Keep your penis clean and dry. Take sitz baths as recommended by your health care provider.  Avoid products that irritate your skin or make symptoms worse, such as soaps and shower gels that have fragrance.  Take  over-the-counter and prescription medicines only as told by your health care provider. ? If you were prescribed an antibiotic medicine or a cream or ointment, use it as told by your health care provider. Do not stop using your medicine, cream, or ointment even if you start to feel better. ? Do not drive or use heavy machinery while taking prescription pain medicine. Contact a health care provider if:  Your symptoms get worse or do not improve with home care.  You develop chills or a fever.  You have trouble urinating.  You cannot retract your foreskin. Get help right away if:  You develop severe pain.  You are unable to urinate. Summary  Balanitis is inflammation of the head of the penis (glans penis) caused by irritation or infection.  Balanitis causes pain, redness, and swelling of the glans penis.  This condition is most common among uncircumcised men who do not keep their glans penis clean and in men who have diabetes.  Treatment may include creams or ointments.  Good hygiene is important for prevention. This includes pulling back the foreskin when washing your penis. This information is not intended to replace advice given to you by your health care provider. Make sure you discuss any questions you have with your health care provider. Document Released: 06/27/2008 Document Revised: 12/29/2015 Document Reviewed: 12/29/2015 Elsevier Interactive Patient Education  2017 [employer].

## 2016-10-21 NOTE — Progress Notes (Signed)
Chief Complaint  Patient presents with  . Rash    on penis x couple months that is sometimes itchy. Wears tight fitting spandex under shorts.    Just before going to [place] 2 months ago he started noticing a problem with rash on his penis.  He thinks that wearing Under Armor tight clothing while working out may contribute.  He wears this to avoid chafing. He describes the rash as flaky, only sometimes slightly itchy, slightly irritated. No painful.  It seems to come and go in severity, never completely resolved.  He has used Clotrimazole, mixed together with antibiotic ointment, and also has tried cortisone cream. He has not used these very consistently, sporadic.   He denies risk for STD No penile discharge, sores, or other concerns.  HTN: He brings in list of his blood pressures. He has been getting very regular exercise.  BP's over the last 3-4 months have been running 122-137/72-81 in the mornings, and 117-134/74-80 in the afternoons.  It was 130/85 earlier today, prior to his appointment.  He denies headaches, dizziness, chest pain  H/o IFG:  Periodically checks sugars at home, <100 (once 115 1.5 hours after lunch) Lab Results  Component Value Date   HGBA1C 5.5 03/24/2016      PMH, PSH, SH reviewed  Outpatient Encounter Prescriptions as of 10/21/2016  Medication Sig Note  . ALPHA LIPOIC ACID PO Take 1-2 tablets by mouth daily.   . Arginine POWD 1-2 scoop by Does not apply route daily.   Marland Kitchen CALCIUM & MAGNESIUM CARBONATES PO Take 1 tablet by mouth daily.     . Coenzyme Q10 10 MG capsule Take 10 mg by mouth daily.   Marland Kitchen escitalopram (LEXAPRO) 10 MG tablet TAKE 1 TABLET BY MOUTH EVERY DAY   . fish oil-omega-3 fatty acids 1000 MG capsule Take 2 g by mouth daily.  03/24/2016: Total of 1200mg  daily  . Lactobacillus (ACIDOPHILUS PO) Take 1 capsule by mouth at bedtime.   . metoprolol succinate (TOPROL-XL) 50 MG 24 hr tablet Take one daily   . vitamin C (ASCORBIC ACID) 500 MG tablet Take 500  mg by mouth daily. Reported on 07/10/2015    Facility-Administered Encounter Medications as of 10/21/2016  Medication  . cefTRIAXone (ROCEPHIN) injection 1 g   Allergies  Allergen Reactions  . Shellfish-Derived Products Anaphylaxis and Nausea And Vomiting   ROS: denies fever, chills, URI symptoms, chest pain, headache, bleeding, bruising, dysuria, hematuria or other concerns, except rash as noted in HPI.  PHYSICAL EXAM: BP (!) 140/94 (BP Location: Right Arm, Cuff Size: Normal)   Pulse 100   Ht 5\' 9"  (1.753 m)   Wt 233 lb 12.8 oz (106.1 kg)   BMI 34.53 kg/m   Wt Readings from Last 3 Encounters:  10/21/16 233 lb 12.8 oz (106.1 kg)  03/24/16 235 lb (106.6 kg)  01/19/16 233 lb 6.4 oz (105.9 kg)   Well developed, slightly excitable male, in no distress HEENT: conjunctiva and sclera are clear, EOMI GU: Inflamed, slightly thickened skin at corona, surrounding glans.  He is circumcized.  There is no flaky rash, no weeping, no sores/lesions. No penile discharge.  No inguinal lymphadenopathy.  Testicles are normal bilaterally.  No hernias  ASSESSMENT/PLAN  Balanitis - mild, suspect partially treated candidal. To use lotrimin BID x 1-2 weeks. Keep dry, cotton underwear, change out of sweaty workout clothes quickly  IFG (impaired fasting glucose) - declines A1c today, prefers to wait until upcoming appointment.   Essential hypertension, benign -  mildly elevated today, better at home   F/u as scheduled next month.

## 2016-10-29 ENCOUNTER — Other Ambulatory Visit: Payer: Self-pay | Admitting: Family Medicine

## 2016-10-29 DIAGNOSIS — F411 Generalized anxiety disorder: Secondary | ICD-10-CM

## 2016-10-29 NOTE — Telephone Encounter (Signed)
Has an appt 9/12. Will refill for 30 days

## 2016-11-02 ENCOUNTER — Other Ambulatory Visit: Payer: Self-pay

## 2016-11-03 ENCOUNTER — Encounter: Payer: Self-pay | Admitting: Family Medicine

## 2016-11-19 ENCOUNTER — Other Ambulatory Visit: Payer: Self-pay

## 2016-11-21 NOTE — Progress Notes (Deleted)
   Seen last month with balanitis.  HTN:  BP has been running 122-137/72-81 in the mornings, and 117-134/74-80 in the afternoons.  He has been getting very regular exercise.  He denies headaches, dizziness, chest pain  Anxiety--taking lexapro, working well to control anxiety; denies side effects.  Father passed away in [facility] in September with pneumonia, sepsis). Prior to his illness, his weight got down to 215#, but regained during frequent travel to [place] related to his dad.  He has not yet lost this weight.  IFG-- He is exercising regularly. He periodically checks sugars at home, are under 100.  He has not lost the weight he gained when his father was sick.  He continues to drink some alcohol on the weekends (had lost weight quickly when he gave up alcohol entirely). He still avoids sugar. Snacks on pretzels. Lab Results  Component Value Date   HGBA1C 5.5 03/24/2016    Mixed hyperlipidemia: Last checked over a year ago.  He had been asked to return for fasting labs, but never did. He tries to follow lowfat, low cholesterol diet. Lab Results  Component Value Date   CHOL 249 (H) 08/20/2015   HDL 58 08/20/2015   LDLCALC 155 (H) 08/20/2015   TRIG 178 (H) 08/20/2015   CHOLHDL 4.3 08/20/2015   He is scheduled for CPE in April (after canceling the last one). He has never had colonoscopy   PMH, PSH, SH reviewed    ROS:   PHYSICAL EXAM:  Wt Readings from Last 3 Encounters:  10/21/16 233 lb 12.8 oz (106.1 kg)  03/24/16 235 lb (106.6 kg)  01/19/16 233 lb 6.4 oz (105.9 kg)      ASSESSMENT/PLAN:   Prev ordered c-met and lipids Past due for CPE and other labs Also adding HepC, PSA, TSH, A1c  F/u as scheduled in April, sooner prn

## 2016-11-22 ENCOUNTER — Telehealth: Payer: Self-pay | Admitting: *Deleted

## 2016-11-22 ENCOUNTER — Other Ambulatory Visit: Payer: Self-pay | Admitting: *Deleted

## 2016-11-22 ENCOUNTER — Encounter: Payer: Self-pay | Admitting: Family Medicine

## 2016-11-22 DIAGNOSIS — F411 Generalized anxiety disorder: Secondary | ICD-10-CM

## 2016-11-22 MED ORDER — ESCITALOPRAM OXALATE 10 MG PO TABS: 10.0000 mg | ORAL_TABLET | Freq: Every day | ORAL | 0 refills | Status: DC

## 2016-11-22 NOTE — Telephone Encounter (Signed)

## 2016-11-22 NOTE — Telephone Encounter (Signed)
Please have this notated as a same day cancellation. He needs to reschedule med check (and fasting labs) within a month.  Per his message, he is unavailable the next 2-3 weeks.  Let's shoot for 4 weeks. He is long past due for labs (ordered in December)

## 2016-12-08 ENCOUNTER — Other Ambulatory Visit: Payer: Self-pay | Admitting: Family Medicine

## 2016-12-10 ENCOUNTER — Other Ambulatory Visit: Payer: Self-pay

## 2016-12-11 ENCOUNTER — Encounter: Payer: Self-pay | Admitting: Family Medicine

## 2016-12-12 NOTE — Progress Notes (Signed)
Chief Complaint  Patient presents with  . Hypertension    nonfasting med check.  . Fever    started Thursday and worse Friday. Worst fever Sat am. Sweating profusely. Headache. No cough or sinus issues. Body aches.     He sent an email on 10/20 reporting fever of 102, with body aches, chills, headache when he moves or shakes his head. Symptoms started 4 days ago (slightly achey), but got worse over the weekend--fever started 2 days ago.  Denies upper respiratory symptoms.  Denies any tick bites, rash, sore throat. Tmax of 103.5.  Ibuprofen (200 or '400mg'$ ) helps, lasts only 3 hours.   His head hurts with a laugh, cough, throat-clearing.  Hurts to shake his head.  It doesn't feel like "sinus pressure", more like the "fever" behind his eyes. Hiking last weekend, wearing a lot of gear ([employer]). Felt more sore than normal with his usual chest and back workouts this past week, and then got sick.  Last seen 8/30 with balanitis. This completely resolved.  He never returned for fasting labs as ordered for him last December.  HTN:  BP has been running 120-135/70's-85. BP is much lower after doing weights. He has been getting very regular exercise.  He denies headaches (just related to current illness), dizziness, chest pain  Anxiety--taking lexapro, working well to control anxiety;denies side effects.  IFG-- He is exercising regularly. He hasn't been checking blood sugars (previously check it periodically).  He hasn't been drinking any alcohol. Tries to limit his carbs.  Lab Results  Component Value Date   HGBA1C 5.5 03/24/2016     Mixed hyperlipidemia: Last checked over a year ago.  He had been asked to return for fasting labs, but never did. He tries to follow lowfat, low cholesterol diet. Lab Results  Component Value Date   CHOL 249 (H) 08/20/2015   HDL 58 08/20/2015   LDLCALC 155 (H) 08/20/2015   TRIG 178 (H) 08/20/2015   CHOLHDL 4.3 08/20/2015   Currently he eats red meat  4-5x/week.  +mayo.  3-4 eggs/week.  Berniece Salines once every 2 weeks.  Some cheese for snacking.  Uses vinaigrette dressings. +butter.  He is NOT fasting today--due to taking ibuprofen for his fever/aches.  He is scheduled for CPE in April (after canceling the last one). He has never had colonoscopy   PMH, PSH, SH reviewed  Outpatient Encounter Prescriptions as of 12/13/2016  Medication Sig Note  . ALPHA LIPOIC ACID PO Take 1-2 tablets by mouth daily.   . Arginine POWD 1-2 scoop by Does not apply route daily.   Marland Kitchen CALCIUM & MAGNESIUM CARBONATES PO Take 1 tablet by mouth daily.     . Coenzyme Q10 10 MG capsule Take 10 mg by mouth daily.   Marland Kitchen escitalopram (LEXAPRO) 10 MG tablet Take 1 tablet (10 mg total) by mouth daily.   . fish oil-omega-3 fatty acids 1000 MG capsule Take 2 g by mouth daily.  03/24/2016: Total of '1200mg'$  daily  . ibuprofen (ADVIL,MOTRIN) 200 MG tablet Take 400 mg by mouth every 6 (six) hours as needed. 12/13/2016: Last dose 11:00am took '200mg'$   . Lactobacillus (ACIDOPHILUS PO) Take 1 capsule by mouth at bedtime.   . metoprolol succinate (TOPROL-XL) 50 MG 24 hr tablet TAKE 1 TABLET EVERY DAY   . vitamin C (ASCORBIC ACID) 500 MG tablet Take 500 mg by mouth daily. Reported on 07/10/2015    Facility-Administered Encounter Medications as of 12/13/2016  Medication  . cefTRIAXone (ROCEPHIN) injection 1 g  Allergies  Allergen Reactions  . Shellfish-Derived Products Anaphylaxis and Nausea And Vomiting     ROS: +fever, chills, headaches.  No dysuria.  Urine is darker from being dehydrated.  No vomiting or diarrhea.  Slight nausea from the ibuprofen (not eating much). No chest pain (just soreness, muscular), shortness of breath. No bleeding, bruising, rash. See HPI   PHYSICAL EXAM:  BP 122/82   Pulse 60   Temp 98.6 F (37 C) (Tympanic)   Ht '5\' 9"'$  (1.753 m)   Wt 227 lb 9.6 oz (103.2 kg)   BMI 33.61 kg/m   Wt Readings from Last 3 Encounters:  12/13/16 227 lb 9.6 oz (103.2  kg)  10/21/16 233 lb 12.8 oz (106.1 kg)  03/24/16 235 lb (106.6 kg)   He feels like he has lost a few pounds just in the last few days (feels dehydrated, not eating much)  Well appearing male, with frequent sniffles and very rare cough.   HEENT: PERRL, EOMI, conjunctiva and sclera are clear.  TM's and EAC's normal. Nasal mucosa is moderately edematous with clear mucus.  OP is clear, no erythema or exudate.  Moist mucus membranes. Sinuses are nontender Neck: no lymphadenopathy, thyromegaly or mass Heart: regular rate and rhythm (pulse is faster than on initial check--starting to feel warmer, likely fever recurring). No murmur Lungs: clear bilaterally Back: no spinal or CVA tenderness Chest: nontender Abdomen: soft, nontender, no organomegaly or mass Extremities: no edema Skin: no rashes visible Psych: normal mood, affect, hygiene and grooming  Influenza A&B negative   ASSESSMENT/PLAN:  Acute viral syndrome - supportive measures reviewed.  no e/o bacterial infection.  Flu test negative  Essential hypertension, benign - controlled - Plan: Comprehensive metabolic panel  Mixed hyperlipidemia - past due for labs; results from 07/2015 reviewed, and lowfat, low chol diet reviewed in detail - Plan: Lipid panel  IFG (impaired fasting glucose) - Plan: Hemoglobin A1c, Comprehensive metabolic panel  Generalized anxiety disorder - well controlled, continue lexapro  Fever and chills - Plan: POC Influenza A&B (Binax test)  Medication monitoring encounter - Plan: Comprehensive metabolic panel  Screening for prostate cancer - Plan: PSA  Annual physical exam - Plan: Hemoglobin A1c, Lipid panel, PSA, TSH, Comprehensive metabolic panel  Encounter for hepatitis C screening test for low risk patient - Plan: Hepatitis C antibody  To contact us if any symptoms change.  Denies any tick bites or rashes. Supportive measures reviewed.  Prev ordered c-met and lipids--nonfasting today. Return for  fasting labs Past due for CPE and other labs Also adding HepC, PSA, TSH, A1c to be drawn when he returns for labs.  F/u as scheduled in April, sooner prn

## 2016-12-13 ENCOUNTER — Ambulatory Visit (INDEPENDENT_AMBULATORY_CARE_PROVIDER_SITE_OTHER): Payer: BLUE CROSS/BLUE SHIELD | Admitting: Family Medicine

## 2016-12-13 ENCOUNTER — Encounter: Payer: Self-pay | Admitting: Family Medicine

## 2016-12-13 VITALS — BP 122/82 | HR 60 | Temp 98.6°F | Ht 69.0 in | Wt 227.6 lb

## 2016-12-13 DIAGNOSIS — F411 Generalized anxiety disorder: Secondary | ICD-10-CM

## 2016-12-13 DIAGNOSIS — R509 Fever, unspecified: Secondary | ICD-10-CM | POA: Diagnosis not present

## 2016-12-13 DIAGNOSIS — Z125 Encounter for screening for malignant neoplasm of prostate: Secondary | ICD-10-CM

## 2016-12-13 DIAGNOSIS — Z5181 Encounter for therapeutic drug level monitoring: Secondary | ICD-10-CM

## 2016-12-13 DIAGNOSIS — B349 Viral infection, unspecified: Secondary | ICD-10-CM | POA: Diagnosis not present

## 2016-12-13 DIAGNOSIS — E782 Mixed hyperlipidemia: Secondary | ICD-10-CM | POA: Diagnosis not present

## 2016-12-13 DIAGNOSIS — I10 Essential (primary) hypertension: Secondary | ICD-10-CM

## 2016-12-13 DIAGNOSIS — Z1159 Encounter for screening for other viral diseases: Secondary | ICD-10-CM | POA: Diagnosis not present

## 2016-12-13 DIAGNOSIS — R7301 Impaired fasting glucose: Secondary | ICD-10-CM | POA: Diagnosis not present

## 2016-12-13 DIAGNOSIS — Z0000 Encounter for general adult medical examination without abnormal findings: Secondary | ICD-10-CM

## 2016-12-13 LAB — POC INFLUENZA A&B (BINAX/QUICKVUE)
Influenza A, POC: NEGATIVE
Influenza B, POC: NEGATIVE

## 2016-12-13 NOTE — Patient Instructions (Addendum)
You may use up to 600 mg of ibuprofen every 6 hours, if needed--you must take this with food.  Tylenol is another good option for the aches and fever, and doesn't require you to eat. You can alternate between these, if needed. You did have nasal congestion.  Avoid decongestants due to it raising the blood pressure.  You can use an antihistamine such as zyrtec or claritin. If/when it moves into your chest, add in guaifenesin (mucinex or robitussin).   Cut back on red meat, mayonnaise.  See handout for more info  Fat and Cholesterol Restricted Diet High levels of fat and cholesterol in your blood may lead to various health problems, such as diseases of the heart, blood vessels, gallbladder, liver, and pancreas. Fats are concentrated sources of energy that come in various forms. Certain types of fat, including saturated fat, may be harmful in excess. Cholesterol is a substance needed by your body in small amounts. Your body makes all the cholesterol it needs. Excess cholesterol comes from the food you eat. When you have high levels of cholesterol and saturated fat in your blood, health problems can develop because the excess fat and cholesterol will gather along the walls of your blood vessels, causing them to narrow. Choosing the right foods will help you control your intake of fat and cholesterol. This will help keep the levels of these substances in your blood within normal limits and reduce your risk of disease. What is my plan? Your health care provider recommends that you:  Limit your fat intake to ______% or less of your total calories per day.  Limit the amount of cholesterol in your diet to less than _________mg per day.  Eat 20-30 grams of fiber each day.  What types of fat should I choose?  Choose healthy fats more often. Choose monounsaturated and polyunsaturated fats, such as olive and canola oil, flaxseeds, walnuts, almonds, and seeds.  Eat more omega-3 fats. Good choices include  salmon, mackerel, sardines, tuna, flaxseed oil, and ground flaxseeds. Aim to eat fish at least two times a week.  Limit saturated fats. Saturated fats are primarily found in animal products, such as meats, butter, and cream. Plant sources of saturated fats include palm oil, palm kernel oil, and coconut oil.  Avoid foods with partially hydrogenated oils in them. These contain trans fats. Examples of foods that contain trans fats are stick margarine, some tub margarines, cookies, crackers, and other baked goods. What general guidelines do I need to follow? These guidelines for healthy eating will help you control your intake of fat and cholesterol:  Check food labels carefully to identify foods with trans fats or high amounts of saturated fat.  Fill one half of your plate with vegetables and green salads.  Fill one fourth of your plate with whole grains. Look for the word "whole" as the first word in the ingredient list.  Fill one fourth of your plate with lean protein foods.  Limit fruit to two servings a day. Choose fruit instead of juice.  Eat more foods that contain fiber, such as apples, broccoli, carrots, beans, peas, and barley.  Eat more home-cooked food and less restaurant, buffet, and fast food.  Limit or avoid alcohol.  Limit foods high in starch and sugar.  Limit fried foods.  Cook foods using methods other than frying. Baking, boiling, grilling, and broiling are all great options.  Lose weight if you are overweight. Losing just 5-10% of your initial body weight can help your overall  health and prevent diseases such as diabetes and heart disease.  What foods can I eat? Grains  Whole grains, such as whole wheat or whole grain breads, crackers, cereals, and pasta. Unsweetened oatmeal, bulgur, barley, quinoa, or brown rice. Corn or whole wheat flour tortillas. Vegetables  Fresh or frozen vegetables (raw, steamed, roasted, or grilled). Green salads. Fruits  All fresh,  canned (in natural juice), or frozen fruits. Meats and other protein foods  Ground beef (85% or leaner), grass-fed beef, or beef trimmed of fat. Skinless chicken or [place]. Ground chicken or [place]. Pork trimmed of fat. All fish and seafood. Eggs. Dried beans, peas, or lentils. Unsalted nuts or seeds. Unsalted canned or dry beans. Dairy  Low-fat dairy products, such as skim or 1% milk, 2% or reduced-fat cheeses, low-fat ricotta or cottage cheese, or plain low-fat yo Fats and oils  Tub margarines without trans fats. Light or reduced-fat mayonnaise and salad dressings. Avocado. Olive, canola, sesame, or safflower oils. Natural peanut or almond butter (choose ones without added sugar and oil). The items listed above may not be a complete list of recommended foods or beverages. Contact your dietitian for more options. Foods to avoid Grains  White bread. White pasta. White rice. Cornbread. Bagels, pastries, and croissants. Crackers that contain trans fat. Vegetables  White potatoes. Corn. Creamed or fried vegetables. Vegetables in a cheese sauce. Fruits  Dried fruits. Canned fruit in light or heavy syrup. Fruit juice. Meats and other protein foods  Fatty cuts of meat. Ribs, chicken wings, bacon, sausage, bologna, salami, chitterlings, fatback, hot dogs, bratwurst, and packaged luncheon meats. Liver and organ meats. Dairy  Whole or 2% milk, cream, half-and-half, and cream cheese. Whole milk cheeses. Whole-fat or sweetened yogurt. Full-fat cheeses. Nondairy creamers and whipped toppings. Processed cheese, cheese spreads, or cheese curds. Beverages  Alcohol. Sweetened drinks (such as sodas, lemonade, and fruit drinks or punches). Fats and oils  Butter, stick margarine, lard, shortening, ghee, or bacon fat. Coconut, palm kernel, or palm oils. Sweets and desserts  Corn syrup, sugars, honey, and molasses. Candy. Jam and jelly. Syrup. Sweetened cereals. Cookies, pies, cakes, donuts, muffins,  and ice cream. The items listed above may not be a complete list of foods and beverages to avoid. Contact your dietitian for more information. This information is not intended to replace advice given to you by your health care provider. Make sure you discuss any questions you have with your health care provider. Document Released: 02/08/2005 Document Revised: 03/01/2014 Document Reviewed: 05/09/2013 Elsevier Interactive Patient Education  2017 [employer].

## 2016-12-16 ENCOUNTER — Other Ambulatory Visit: Payer: BLUE CROSS/BLUE SHIELD

## 2016-12-16 DIAGNOSIS — I10 Essential (primary) hypertension: Secondary | ICD-10-CM

## 2016-12-16 DIAGNOSIS — Z5181 Encounter for therapeutic drug level monitoring: Secondary | ICD-10-CM

## 2016-12-16 DIAGNOSIS — Z1159 Encounter for screening for other viral diseases: Secondary | ICD-10-CM

## 2016-12-16 DIAGNOSIS — Z0000 Encounter for general adult medical examination without abnormal findings: Secondary | ICD-10-CM

## 2016-12-16 DIAGNOSIS — Z125 Encounter for screening for malignant neoplasm of prostate: Secondary | ICD-10-CM

## 2016-12-16 DIAGNOSIS — R7301 Impaired fasting glucose: Secondary | ICD-10-CM

## 2016-12-16 DIAGNOSIS — E782 Mixed hyperlipidemia: Secondary | ICD-10-CM

## 2016-12-17 ENCOUNTER — Encounter: Payer: Self-pay | Admitting: Family Medicine

## 2016-12-17 LAB — LIPID PANEL
Cholesterol: 237 mg/dL — ABNORMAL HIGH (ref <200)
HDL: 38 mg/dL — ABNORMAL LOW (ref >40)
LDL CHOLESTEROL (CALC): 171 mg/dL — AB
NON-HDL CHOLESTEROL (CALC): 199 mg/dL — AB (ref <130)
TRIGLYCERIDES: 141 mg/dL (ref <150)
Total CHOL/HDL Ratio: 6.2 (calc) — ABNORMAL HIGH (ref <5.0)

## 2016-12-17 LAB — COMPREHENSIVE METABOLIC PANEL
AG RATIO: 1.2 (calc) (ref 1.0–2.5)
ALKALINE PHOSPHATASE (APISO): 53 U/L (ref 40–115)
ALT: 25 U/L (ref 9–46)
AST: 23 U/L (ref 10–35)
Albumin: 3.7 g/dL (ref 3.6–5.1)
BILIRUBIN TOTAL: 0.6 mg/dL (ref 0.2–1.2)
BUN: 11 mg/dL (ref 7–25)
CALCIUM: 8.7 mg/dL (ref 8.6–10.3)
CHLORIDE: 97 mmol/L — AB (ref 98–110)
CO2: 24 mmol/L (ref 20–32)
Creat: 1.12 mg/dL (ref 0.70–1.33)
GLOBULIN: 3.1 g/dL (ref 1.9–3.7)
Glucose, Bld: 103 mg/dL — ABNORMAL HIGH (ref 65–99)
Potassium: 4.2 mmol/L (ref 3.5–5.3)
Sodium: 135 mmol/L (ref 135–146)
Total Protein: 6.8 g/dL (ref 6.1–8.1)

## 2016-12-17 LAB — PSA: PSA: 1 ng/mL (ref <=4.0)

## 2016-12-17 LAB — HEPATITIS C ANTIBODY
Hepatitis C Ab: NONREACTIVE
SIGNAL TO CUT-OFF: 0.04 (ref <1.00)

## 2016-12-17 LAB — TSH: TSH: 2.66 mIU/L (ref 0.40–4.50)

## 2016-12-17 LAB — HEMOGLOBIN A1C
Hgb A1c MFr Bld: 5.6 % of total Hgb (ref <5.7)
Mean Plasma Glucose: 114 (calc)
eAG (mmol/L): 6.3 (calc)

## 2016-12-20 ENCOUNTER — Other Ambulatory Visit: Payer: Self-pay | Admitting: *Deleted

## 2016-12-20 DIAGNOSIS — E782 Mixed hyperlipidemia: Secondary | ICD-10-CM

## 2017-01-02 ENCOUNTER — Other Ambulatory Visit: Payer: Self-pay | Admitting: Family Medicine

## 2017-01-02 DIAGNOSIS — F411 Generalized anxiety disorder: Secondary | ICD-10-CM

## 2017-03-05 ENCOUNTER — Other Ambulatory Visit: Payer: Self-pay | Admitting: Family Medicine

## 2017-03-16 ENCOUNTER — Other Ambulatory Visit: Payer: Self-pay

## 2017-06-01 ENCOUNTER — Encounter: Payer: Self-pay | Admitting: Family Medicine

## 2017-06-09 ENCOUNTER — Encounter: Payer: Self-pay | Admitting: *Deleted

## 2017-06-09 LAB — HM COLONOSCOPY

## 2017-06-10 ENCOUNTER — Encounter: Payer: Self-pay | Admitting: Family Medicine

## 2017-06-13 ENCOUNTER — Encounter: Payer: Self-pay | Admitting: Family Medicine

## 2017-07-05 ENCOUNTER — Other Ambulatory Visit (INDEPENDENT_AMBULATORY_CARE_PROVIDER_SITE_OTHER): Payer: BLUE CROSS/BLUE SHIELD

## 2017-07-05 DIAGNOSIS — Z111 Encounter for screening for respiratory tuberculosis: Secondary | ICD-10-CM | POA: Diagnosis not present

## 2017-07-07 LAB — TB SKIN TEST: TB SKIN TEST: NEGATIVE

## 2017-07-26 ENCOUNTER — Telehealth: Payer: Self-pay | Admitting: Family Medicine

## 2017-07-26 NOTE — Telephone Encounter (Signed)
Give him the price of the QuantiFERON and go ahead and schedule it if he wants it.

## 2017-07-26 NOTE — Telephone Encounter (Signed)
  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 There are 2 different tests that they will accept.  One is called quantiferon TB gold and the other is T spot blood test  If so, he would like to know cost

## 2017-07-26 NOTE — Telephone Encounter (Signed)
Informed patient that per Dr. Redmond School, we can do Qunatiferon and per [facility] price is $120.00 He will call back to get that scheduled

## 2017-08-13 ENCOUNTER — Other Ambulatory Visit: Payer: Self-pay | Admitting: Family Medicine

## 2017-08-13 DIAGNOSIS — F411 Generalized anxiety disorder: Secondary | ICD-10-CM

## 2017-08-15 NOTE — Telephone Encounter (Signed)
Is this okay to refill? Does not have appt scheduled until October. When he was last here for PPD I tried to get him to come in sooner and he told me he was unable to with his schedule sooner than Oct.

## 2017-08-15 NOTE — Telephone Encounter (Signed)
Ok to refill until October.  Sooner visit preferable.  I will NOT continue after October, as he won't have been seen for a year (if he cancels that appt)

## 2017-09-01 ENCOUNTER — Other Ambulatory Visit: Payer: Self-pay | Admitting: Family Medicine

## 2017-09-01 NOTE — Telephone Encounter (Signed)
Is this okay to refill? I've tried to get him in for med check numerous times. When he was here for PPD last time I spoke with him and he said he was just really busy and he would just come in for CPE in October. What would you like me to do?

## 2017-11-30 ENCOUNTER — Other Ambulatory Visit: Payer: Self-pay | Admitting: Family Medicine

## 2017-11-30 NOTE — Telephone Encounter (Signed)
Left message to see if pt had enough med until tomorrow's visit

## 2017-11-30 NOTE — Progress Notes (Signed)
Chief Complaint  Patient presents with  . Annual Exam    annual exam, patient is not fasting. Just had eye exam. No concerns. Thinks he may have a hernia on his left side.   . Flu Vaccine    will get through work.     Eddie Harris is a 57 y.o. male who presents for a complete physical.  He has the following concerns:  Thinks he may have a left sided inguinal hernia. He notices a lump, that goes away when he pushes on it.   No pain with exercise.  Slight burning if standing/walking for a long time. Hernia Truss--wears it when he works out, can't tell a difference, gives support. Promoted to a trainer position, traveling more.  He is home next week, can return for fasting labs; he is not fasting today. He is past due for fasting labs.  HTN-- He is taking 25 mg (1/2 tablet of 50mg ) of metoprolol once daily.  BP's at home are running 120-140 (mostly 125-130) /73-80 Denies side effects. He is surprised that it is high today. He reports we verified his monitor as accurate in the past.  Anxiety--taking lexapro, tolerating without side effects, and he feels that this is effective.   Obesity: Weight got down to 215# but regained during frequent travel to [place] related to his dad prior to his death. He significantly decreased his carb intake, he is exercising regularly.  He feels like all weight gain has been muscular, as he is eating well and exercising daily.  IFG--periodically checks sugars at home and have been <100 fasting (89-93).   Mixed hyperlipidemia--after his last labs a year ago, when LDL was even higher than prior check, it was recommended that he start statin (suggested Crestor or atorvastatin).  He refused, and was asked to f/u 3 months (he was going to work on diet).  He never scheduled 02/2017 labs.   Eats a banana and apple daily. Exercises daily. Eating more white meat, chicken, tuna. +mayonnaise on tuna.  4-5 eggs/week, red meat 3/week. Not much cheese (since he cut out  carbs, no longer has cheese and crackers).  Lab Results  Component Value Date   CHOL 237 (H) 12/16/2016   HDL 38 (L) 12/16/2016   LDLCALC 171 (H) 12/16/2016   TRIG 141 12/16/2016   CHOLHDL 6.2 (H) 12/16/2016    Immunization History  Administered Date(s) Administered  . Hepatitis A 08/10/2005, 05/27/2006  . Influenza Split 11/26/2012, 12/05/2013, 12/03/2014  . Influenza, Seasonal, Injecte, Preservative Fre 02/07/2012  . Influenza-Unspecified 03/14/2016, 10/20/2016  . PPD Test 05/14/2011, 02/07/2012, 05/11/2012, 06/27/2013, 06/18/2015, 06/21/2016, 07/05/2017  . Tdap 08/10/2005, 08/18/2015   Last colonoscopy: 05/2017 [name], internal hemorrhoids Last PSA: last year Lab Results  Component Value Date   PSA 1.0 12/16/2016   PSA 1.21 12/19/2014   PSA 0.97 08/20/2011  Dentist: twice a year Ophtho: recent, got contacts (single vision for distance, and readers) Exercise: 7 days/week--walking/elliptical/treadmill x 45 minutes.  Weights 4x/week.  Past Medical History:  Diagnosis Date  . Anxiety   . Atrial fibrillation ([facility]) 2000   negative cardiac workup ([name]).  Episode occurred after heavy drinking  . GERD (gastroesophageal reflux disease)    silent--EGD [name]; pt denies reflux, saw duodenal irritation on EGD  . Hyperlipidemia   . Mild obesity   . Ocular migraine    [name]  . Paroxysmal atrial fibrillation ([facility])   . PVC (premature ventricular contraction)   . Pyloric stenosis  as an infant; vs hernia repair--both documented in his chart  . Skin cancer, basal cell    [name]    Past Surgical History:  Procedure Laterality Date  . basal cell skin cancer excision    . HERNIA REPAIR  age 39 weeks   ?diaphragmatic given age    Social History   Socioeconomic History  . Marital status: Married    Spouse name: Not on file  . Number of children: 2  . Years of education: Not on file  . Highest education level: Not on file  Occupational  History  . Occupation: sells Technical sales engineer (medical supplies)  Social Needs  . Financial resource strain: Not on file  . Food insecurity:    Worry: Not on file    Inability: Not on file  . Transportation needs:    Medical: Not on file    Non-medical: Not on file  Tobacco Use  . Smoking status: Former Smoker    Last attempt to quit: 02/23/1987    Years since quitting: 30.7  . Smokeless tobacco: Never Used  Substance and Sexual Activity  . Alcohol use: No    Alcohol/week: 0.0 standard drinks    Comment: 3 drinks 3 nights/week--stopped 07/2015  . Drug use: No  . Sexual activity: Yes    Partners: Female    Comment: infrequent  Lifestyle  . Physical activity:    Days per week: Not on file    Minutes per session: Not on file  . Stress: Not on file  Relationships  . Social connections:    Talks on phone: Not on file    Gets together: Not on file    Attends religious service: Not on file    Active member of club or organization: Not on file    Attends meetings of clubs or organizations: Not on file    Relationship status: Not on file  . Intimate partner violence:    Fear of current or ex partner: Not on file    Emotionally abused: Not on file    Physically abused: Not on file    Forced sexual activity: Not on file  Other Topics Concern  . Not on file  Social History Narrative   Married, 2 children. Moved office back into his house (due to financial strains). Travels/drives a lot    Family History  Adopted: Yes  Problem Relation Age of Onset  . Asthma Son    Birth mother lives in [place], no significant health problems on mother's side. Father's hx unknown.  Outpatient Encounter Medications as of 12/01/2017  Medication Sig Note  . ALPHA LIPOIC ACID PO Take 1-2 tablets by mouth daily.   . Arginine POWD 1-2 scoop by Does not apply route daily.   Marland Kitchen CALCIUM & MAGNESIUM CARBONATES PO Take 1 tablet by mouth daily.     . Coenzyme Q10 10 MG capsule Take 10 mg by mouth daily.    Marland Kitchen escitalopram (LEXAPRO) 10 MG tablet Take 1 tablet (10 mg total) by mouth daily.   . fish oil-omega-3 fatty acids 1000 MG capsule Take 2 g by mouth daily.  03/24/2016: Total of 1200mg  daily  . Lactobacillus (ACIDOPHILUS PO) Take 1 capsule by mouth at bedtime.   . metoprolol succinate (TOPROL-XL) 50 MG 24 hr tablet TAKE 1 TABLET BY MOUTH EVERY DAY 12/01/2017: 1/2 tablet daily  . vitamin C (ASCORBIC ACID) 500 MG tablet Take 500 mg by mouth daily. Reported on 07/10/2015   . [DISCONTINUED] escitalopram (LEXAPRO)  10 MG tablet TAKE 1 TABLET BY MOUTH EVERY DAY   . ibuprofen (ADVIL,MOTRIN) 200 MG tablet Take 400 mg by mouth every 6 (six) hours as needed.    Facility-Administered Encounter Medications as of 12/01/2017  Medication  . cefTRIAXone (ROCEPHIN) injection 1 g    Allergies  Allergen Reactions  . Shellfish-Derived Products Anaphylaxis and Nausea And Vomiting    ROS:  The patient denies anorexia, fever, headaches, vision loss, decreased hearing, ear pain, hoarseness, chest pain, palpitations, dizziness, syncope, dyspnea on exertion, cough, swelling, nausea, vomiting, diarrhea, constipation, abdominal pain, melena, hematochezia, indigestion/heartburn, hematuria, incontinence, erectile dysfunction, nocturia, weakened urine stream, dysuria, genital lesions, joint pains, numbness, tingling, weakness, tremor, suspicious skin lesions, depression, abnormal bleeding/bruising, or enlarged lymph nodes Occasional PVC.  Denies tachycardia. Has trouble swallowing capsules, unless he takes with a lot of food. +snoring, mild.  Not aware of any apnea.  Denies daytime somnolence, often feels refreshed in the mornings, if he slept well No further reflux. Internal hemorrhoids, no problems flaring since eating an apple/day Left groin/hernia swelling/possible hernia per pt Right wrist pain intermittently, worsened with weights   PHYSICAL EXAM:  BP (!) 180/100   Pulse 100   Ht 5\' 9"  (1.753 m)   Wt 233 lb  9.6 oz (106 kg)   BMI 34.50 kg/m   162/112 on repeat by MD, remained borderline tachycardic, excitable  Wt Readings from Last 3 Encounters:  12/01/17 233 lb 9.6 oz (106 kg)  12/13/16 227 lb 9.6 oz (103.2 kg)  10/21/16 233 lb 12.8 oz (106.1 kg)    General Appearance:    Alert, cooperative, no distress, appears stated age. Somewhat excitable. Was diaphoretic when initially evaluated by nurse (tachycardic, higher BP), was calmer, not diaphoretic during visit with me.  Head:    Normocephalic, without obvious abnormality, atraumatic  Eyes:    PERRL, conjunctiva/corneas clear, EOM's intact, fundi benign  Ears:    Normal TM's and external ear canals  Nose:   Nares normal, mucosa normal, no drainage or sinus   tenderness  Throat:   Lips, mucosa, and tongue normal; teeth and gums normal  Neck:   Supple, no lymphadenopathy;  thyroid:  no enlargement/ tenderness/nodules; no carotid bruit or JVD  Back:    Spine nontender, no curvature, ROM normal, no CVA   tenderness  Lungs:     Clear to auscultation bilaterally without wheezes, rales or   ronchi; respirations unlabored  Chest Wall:    No tenderness or deformity   Heart:    Regular rhythm, S1 and S2 normal, no murmur, rub or gallop. Still mildly tachycardic, pulse around 100 during my eval  Breast Exam:    No chest wall tenderness, masses or gynecomastia  Abdomen:     Soft, non-tender, nondistended, normoactive bowel sounds,    no masses, no hepatosplenomegaly  Genitalia:    Normal male external genitalia without lesions.  Testicles without masses.  Hernia palpable in left inguinal canal. Nontender, reducible.  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  Lab Results  Component Value Date   HGBA1C 5.4 12/01/2017    ASSESSMENT/PLAN:  Annual physical exam - Plan: POCT Urinalysis DIP (Proadvantage Device), Lipid panel, Comprehensive metabolic panel, CBC with Differential/Platelet, PSA  Mixed hyperlipidemia - diet and goals reviewed. ASCVD based on last year's info was intermid risk x 25yr, 50% for lifetime. Willing to take statin if remains high - Plan: Lipid panel  Essential hypertension, benign - high today, and tachycardic, suspect underlying anxiety; monitor regularly at home; to bring monitor for recheck at lab visit next week - Plan: Comprehensive metabolic panel  IFG (impaired fasting glucose) - continue low carb diet, regular exercise. Weight loss (especially around mid-section) strongly encouraged - Plan: HgB A1c  Screening for prostate cancer - declines DRE today; will check PSA when he returns for fasting labs - Plan: PSA  Generalized anxiety disorder - controlled with lexapro - Plan: escitalopram (LEXAPRO) 10 MG tablet  55 min visit, significant counseling provided separate from routine health maintenance, in discussing lipids, weight, HTN, prediabetes.   Counseled extensively regarding risks of hyperlipidemia--he felt like his healthy diet and exercise were enough to protect him, and he was corrected on this. We discussed statins in great detail, with my recommendation being crestor or lipitor generics.  We had also discusse coronary calcium screening test, but I discouraged that--may be extra delay, expense, for the same outcome of recommending statin--prefer just to try statin.  If he is intolerant, we can reconsider that.  He already takes a coenzyme q10. Risks/SE  of statins reviewed in detail, with the plan to start if needed based on his fasting lipids, which he will return to have drawn next week (not fasting today).  Counseled extensively regarding CAD, how it develops, controllable risk factors, and hereditary ones, out of his control.   Discussed PSA screening (risks/benefits), recommended at least 30 minutes of aerobic activity at least 5 days/week, weight-bearing exercise at least 2x/week; proper sunscreen use reviewed; healthy diet and alcohol recommendations (less than or equal to 2 drinks/day) reviewed; regular seatbelt use; changing batteries in smoke detectors. Self-testicular exams. Immunization recommendations discussed--he plans to get flu shot at [facility], advised to contact us with the date. Shingrix recommended, to check his insurance. Risks/SE reviewed.  Colonoscopy recommendations reviewed, UTD     Please contact us with the date you got your flu shot.  Continue to monitor you blood pressure at home. Record and bring the list with you next week when you come for your lab visit. Just to be doubly sure--bring your monitor with you again, so we can recheck the accuracy.

## 2017-11-30 NOTE — Patient Instructions (Addendum)
HEALTH MAINTENANCE RECOMMENDATIONS:  It is recommended that you get at least 30 minutes of aerobic exercise at least 5 days/week (for weight loss, you may need as much as 60-90 minutes). This can be any activity that gets your heart rate up. This can be divided in 10-15 minute intervals if needed, but try and build up your endurance at least once a week.  Weight bearing exercise is also recommended twice weekly.  Eat a healthy diet with lots of vegetables, fruits and fiber.  "Colorful" foods have a lot of vitamins (ie green vegetables, tomatoes, red peppers, etc).  Limit sweet tea, regular sodas and alcoholic beverages, all of which has a lot of calories and sugar.  Up to 2 alcoholic drinks daily may be beneficial for men (unless trying to lose weight, watch sugars).  Drink a lot of water.  Sunscreen of at least SPF 30 should be used on all sun-exposed parts of the skin when outside between the hours of 10 am and 4 pm (not just when at beach or pool, but even with exercise, golf, tennis, and yard work!)  Use a sunscreen that says "broad spectrum" so it covers both UVA and UVB rays, and make sure to reapply every 1-2 hours.  Remember to change the batteries in your smoke detectors when changing your clock times in the spring and fall.  Use your seat belt every time you are in a car, and please drive safely and not be distracted with cell phones and texting while driving.   I recommend getting the new shingles vaccine (Shingrix). You will need to check with your insurance to see if it is covered (and at office vs pharmacy)  It is a series of 2 injections, spaced 2 months apart. Schedule a nurse visit if covered at office, but remember that it can make you feel flu-like for a few days, so plan accordingly.  Please contact us with the date you got your flu shot.  Continue to monitor you blood pressure at home. Record and bring the list with you next week when you come for your lab visit. Just to be  doubly sure--bring your monitor with you again, so we can recheck the accuracy.   Inguinal Hernia, Adult An inguinal hernia is when fat or the intestines push through the area where the leg meets the lower abdomen (groin) and create a rounded lump (bulge). This condition develops over time. There are three types of inguinal hernias. These types include:  Hernias that can be pushed back into the belly (are reducible).  Hernias that are not reducible (are incarcerated).  Hernias that are not reducible and lose their blood supply (are strangulated). This type of hernia requires emergency surgery.  What are the causes? This condition is caused by having a weak spot in the muscles or tissue. This weakness lets the hernia poke through. This condition can be triggered by:  Suddenly straining the muscles of the lower abdomen.  Lifting heavy objects.  Straining to have a bowel movement. Difficult bowel movements (constipation) can lead to this.  Coughing.  What increases the risk? This condition is more likely to develop in:  Men.  Pregnant women.  People who: ? Are overweight. ? Work in jobs that require long periods of standing or heavy lifting. ? Have had an inguinal hernia before. ? Smoke or have lung disease. These factors can lead to long-lasting (chronic) coughing.  What are the signs or symptoms? Symptoms can depend on the size of  the hernia. Often, a small inguinal hernia has no symptoms. Symptoms of a larger hernia include:  A lump in the groin. This is easier to see when the person is standing. It might not be visible when he or she is lying down.  Pain or burning in the groin. This occurs especially when lifting, straining, or coughing.  A dull ache or a feeling of pressure in the groin.  A lump in the scrotum in men.  Symptoms of a strangulated inguinal hernia can include:  A bulge in the groin that is very painful and tender to the touch.  A bulge that turns  red or purple.  Fever, nausea, and vomiting.  The inability to have a bowel movement or to pass gas.  How is this diagnosed? This condition is diagnosed with a medical history and physical exam. Your health care provider may feel your groin area and ask you to cough. How is this treated? Treatment for this condition varies depending on the size of your hernia and whether you have symptoms. If you do not have symptoms, your health care provider may have you watch your hernia carefully and come in for follow-up visits. If your hernia is larger or if you have symptoms, your treatment will include surgery. Follow these instructions at home: Lifestyle  Drink enough fluid to keep your urine clear or pale yellow.  Eat a diet that includes a lot of fiber. Eat plenty of fruits, vegetables, and whole grains. Talk with your health care provider if you have questions.  Avoid lifting heavy objects.  Avoid standing for long periods of time.  Do not use tobacco products, including cigarettes, chewing tobacco, or e-cigarettes. If you need help quitting, ask your health care provider.  Maintain a healthy weight. General instructions  Do not try to force the hernia back in.  Watch your hernia for any changes in color or size. Let your health care provider know if any changes occur.  Take over-the-counter and prescription medicines only as told by your health care provider.  Keep all follow-up visits as told by your health care provider. This is important. Contact a health care provider if:  You have a fever.  You have new symptoms.  Your symptoms get worse. Get help right away if:  You have pain in the groin that suddenly gets worse.  A bulge in the groin gets bigger suddenly and does not go down.  You are a man and you have a sudden pain in the scrotum, or the size of your scrotum suddenly changes.  A bulge in the groin area becomes red or purple and is painful to the touch.  You  have nausea or vomiting that does not go away.  You feel your heart beating a lot more quickly than normal.  You cannot have a bowel movement or pass gas. This information is not intended to replace advice given to you by your health care provider. Make sure you discuss any questions you have with your health care provider. Document Released: 06/27/2008 Document Revised: 07/17/2015 Document Reviewed: 12/19/2013 Elsevier Interactive Patient Education  2018 [place].    Low-Sodium Eating Plan Sodium, which is an element that makes up salt, helps you maintain a healthy balance of fluids in your body. Too much sodium can increase your blood pressure and cause fluid and waste to be held in your body. Your health care provider or dietitian may recommend following this plan if you have high blood pressure (hypertension), kidney  disease, liver disease, or heart failure. Eating less sodium can help lower your blood pressure, reduce swelling, and protect your heart, liver, and kidneys. What are tips for following this plan? General guidelines  Most people on this plan should limit their sodium intake to 1,500-2,000 mg (milligrams) of sodium each day. Reading food labels  The Nutrition Facts label lists the amount of sodium in one serving of the food. If you eat more than one serving, you must multiply the listed amount of sodium by the number of servings.  Choose foods with less than 140 mg of sodium per serving.  Avoid foods with 300 mg of sodium or more per serving. Shopping  Look for lower-sodium products, often labeled as "low-sodium" or "no salt added."  Always check the sodium content even if foods are labeled as "unsalted" or "no salt added".  Buy fresh foods. ? Avoid canned foods and premade or frozen meals. ? Avoid canned, cured, or processed meats  Buy breads that have less than 80 mg of sodium per slice. Cooking  Eat more home-cooked food and less restaurant, buffet, and  fast food.  Avoid adding salt when cooking. Use salt-free seasonings or herbs instead of table salt or sea salt. Check with your health care provider or pharmacist before using salt substitutes.  Cook with plant-based oils, such as canola, sunflower, or olive oil. Meal planning  When eating at a restaurant, ask that your food be prepared with less salt or no salt, if possible.  Avoid foods that contain MSG (monosodium glutamate). MSG is sometimes added to [place] food, bouillon, and some canned foods. What foods are recommended? The items listed may not be a complete list. Talk with your dietitian about what dietary choices are best for you. Grains Low-sodium cereals, including oats, puffed wheat and rice, and shredded wheat. Low-sodium crackers. Unsalted rice. Unsalted pasta. Low-sodium bread. Whole-grain breads and whole-grain pasta. Vegetables Fresh or frozen vegetables. "No salt added" canned vegetables. "No salt added" tomato sauce and paste. Low-sodium or reduced-sodium tomato and vegetable juice. Fruits Fresh, frozen, or canned fruit. Fruit juice. Meats and other protein foods Fresh or frozen (no salt added) meat, poultry, seafood, and fish. Low-sodium canned tuna and salmon. Unsalted nuts. Dried peas, beans, and lentils without added salt. Unsalted canned beans. Eggs. Unsalted nut butters. Dairy Milk. Soy milk. Cheese that is naturally low in sodium, such as ricotta cheese, fresh mozzarella, or Swiss cheese Low-sodium or reduced-sodium cheese. Cream cheese. Yogurt. Fats and oils Unsalted butter. Unsalted margarine with no trans fat. Vegetable oils such as canola or olive oils. Seasonings and other foods Fresh and dried herbs and spices. Salt-free seasonings. Low-sodium mustard and ketchup. Sodium-free salad dressing. Sodium-free light mayonnaise. Fresh or refrigerated horseradish. Lemon juice. Vinegar. Homemade, reduced-sodium, or low-sodium soups. Unsalted popcorn and pretzels.  Low-salt or salt-free chips. What foods are not recommended? The items listed may not be a complete list. Talk with your dietitian about what dietary choices are best for you. Grains Instant hot cereals. Bread stuffing, pancake, and biscuit mixes. Croutons. Seasoned rice or pasta mixes. Noodle soup cups. Boxed or frozen macaroni and cheese. Regular salted crackers. Self-rising flour. Vegetables Sauerkraut, pickled vegetables, and relishes. Olives. [place] fries. Onion rings. Regular canned vegetables (not low-sodium or reduced-sodium). Regular canned tomato sauce and paste (not low-sodium or reduced-sodium). Regular tomato and vegetable juice (not low-sodium or reduced-sodium). Frozen vegetables in sauces. Meats and other protein foods Meat or fish that is salted, canned, smoked, spiced, or pickled. [place] Salines,  ham, sausage, hotdogs, corned beef, chipped beef, packaged lunch meats, salt pork, jerky, pickled herring, anchovies, regular canned tuna, sardines, salted nuts. Dairy Processed cheese and cheese spreads. Cheese curds. Blue cheese. Feta cheese. String cheese. Regular cottage cheese. Buttermilk. Canned milk. Fats and oils Salted butter. Regular margarine. Ghee. Bacon fat. Seasonings and other foods Onion salt, garlic salt, seasoned salt, table salt, and sea salt. Canned and packaged gravies. Worcestershire sauce. Tartar sauce. Barbecue sauce. Teriyaki sauce. Soy sauce, including reduced-sodium. Steak sauce. Fish sauce. Oyster sauce. Cocktail sauce. Horseradish that you find on the shelf. Regular ketchup and mustard. Meat flavorings and tenderizers. Bouillon cubes. Hot sauce and Tabasco sauce. Premade or packaged marinades. Premade or packaged taco seasonings. Relishes. Regular salad dressings. Salsa. Potato and tortilla chips. Corn chips and puffs. Salted popcorn and pretzels. Canned or dried soups. Pizza. Frozen entrees and pot pies. Summary  Eating less sodium can help lower your blood pressure,  reduce swelling, and protect your heart, liver, and kidneys.  Most people on this plan should limit their sodium intake to 1,500-2,000 mg (milligrams) of sodium each day.  Canned, boxed, and frozen foods are high in sodium. Restaurant foods, fast foods, and pizza are also very high in sodium. You also get sodium by adding salt to food.  Try to cook at home, eat more fresh fruits and vegetables, and eat less fast food, canned, processed, or prepared foods. This information is not intended to replace advice given to you by your health care provider. Make sure you discuss any questions you have with your health care provider. Document Released: 07/31/2001 Document Revised: 02/02/2016 Document Reviewed: 02/02/2016 Elsevier Interactive Patient Education  [employer].

## 2017-12-01 ENCOUNTER — Encounter: Payer: Self-pay | Admitting: Family Medicine

## 2017-12-01 ENCOUNTER — Ambulatory Visit (INDEPENDENT_AMBULATORY_CARE_PROVIDER_SITE_OTHER): Payer: BLUE CROSS/BLUE SHIELD | Admitting: Family Medicine

## 2017-12-01 VITALS — BP 180/100 | HR 100 | Ht 69.0 in | Wt 233.6 lb

## 2017-12-01 DIAGNOSIS — R7301 Impaired fasting glucose: Secondary | ICD-10-CM | POA: Diagnosis not present

## 2017-12-01 DIAGNOSIS — Z125 Encounter for screening for malignant neoplasm of prostate: Secondary | ICD-10-CM | POA: Diagnosis not present

## 2017-12-01 DIAGNOSIS — E782 Mixed hyperlipidemia: Secondary | ICD-10-CM | POA: Diagnosis not present

## 2017-12-01 DIAGNOSIS — Z0000 Encounter for general adult medical examination without abnormal findings: Secondary | ICD-10-CM | POA: Diagnosis not present

## 2017-12-01 DIAGNOSIS — I10 Essential (primary) hypertension: Secondary | ICD-10-CM | POA: Diagnosis not present

## 2017-12-01 DIAGNOSIS — F411 Generalized anxiety disorder: Secondary | ICD-10-CM

## 2017-12-01 LAB — POCT URINALYSIS DIP (PROADVANTAGE DEVICE)
BILIRUBIN UA: NEGATIVE
BILIRUBIN UA: NEGATIVE mg/dL
Glucose, UA: NEGATIVE mg/dL
Leukocytes, UA: NEGATIVE
NITRITE UA: NEGATIVE
PH UA: 6.5 (ref 5.0–8.0)
PROTEIN UA: NEGATIVE mg/dL
RBC UA: NEGATIVE
Specific Gravity, Urine: 1.005
Urobilinogen, Ur: NEGATIVE

## 2017-12-01 LAB — POCT GLYCOSYLATED HEMOGLOBIN (HGB A1C): HEMOGLOBIN A1C: 5.4 % (ref 4.0–5.6)

## 2017-12-01 MED ORDER — ESCITALOPRAM OXALATE 10 MG PO TABS: 10.0000 mg | ORAL_TABLET | Freq: Every day | ORAL | 11 refills | Status: DC

## 2017-12-09 ENCOUNTER — Other Ambulatory Visit: Payer: BLUE CROSS/BLUE SHIELD

## 2017-12-19 ENCOUNTER — Telehealth: Payer: Self-pay | Admitting: *Deleted

## 2017-12-19 NOTE — Telephone Encounter (Signed)
Called patient to schedule fasting lab appt-he said he was out of town and he would call back next week.

## 2017-12-31 ENCOUNTER — Other Ambulatory Visit: Payer: Self-pay | Admitting: Family Medicine

## 2018-01-02 NOTE — Telephone Encounter (Signed)
Left message and refilled for 30.

## 2018-01-02 NOTE — Telephone Encounter (Signed)
What would you like me to do with this? I called him to reschedule lab visit once and he has not yet. Call again?

## 2018-01-02 NOTE — Telephone Encounter (Signed)
Refill #30 only.  No further until labs done, and leave that info on his message

## 2018-01-11 ENCOUNTER — Other Ambulatory Visit: Payer: Self-pay | Admitting: Family Medicine

## 2018-01-11 DIAGNOSIS — F411 Generalized anxiety disorder: Secondary | ICD-10-CM

## 2018-02-08 ENCOUNTER — Encounter: Payer: Self-pay | Admitting: Family Medicine

## 2018-02-13 ENCOUNTER — Other Ambulatory Visit: Payer: Self-pay | Admitting: Family Medicine

## 2018-02-13 DIAGNOSIS — F411 Generalized anxiety disorder: Secondary | ICD-10-CM

## 2018-02-13 NOTE — Telephone Encounter (Signed)
Please call him and remind him to schedule labs for this week (and offer OV to discuss his personal issues if he would like). Also, let him know that there are many factors that can contribute--hypertension alone (as contributing to vascular disease), definitely the toprol, as well as anxiety. [place] for #30, no refills

## 2018-02-13 NOTE — Telephone Encounter (Signed)
Both mailboxes are full.

## 2018-02-13 NOTE — Telephone Encounter (Signed)
Is this okay to refill? He still has not scheduled to come in for labs.

## 2018-03-13 ENCOUNTER — Other Ambulatory Visit: Payer: Self-pay | Admitting: Family Medicine

## 2018-03-13 DIAGNOSIS — F411 Generalized anxiety disorder: Secondary | ICD-10-CM

## 2018-03-13 NOTE — Telephone Encounter (Signed)
He still hasn't schedule his fasting lab visit (from his physical in October.). refll #30 only, and advise him of the need to schedule his fasting labs.  If there is another lab that is more convenient that he would prefer to go to, we can arrange for written orders (the [facility] no longer has Saturday hours), but we need to make sure that we are completing his wellness visit, which includes his labwork, which he still hasn't done.  We will refill lexapro only on 30d quantity until he has his labs, then can change to 90d until next physical (which needs to be scheduled at some point).

## 2018-03-13 NOTE — Telephone Encounter (Signed)
Is this okay to refill? I called and left a message on his voicemail again asking him to call and schedule appt for fasting labs.

## 2018-04-05 ENCOUNTER — Other Ambulatory Visit: Payer: Self-pay | Admitting: Family Medicine

## 2018-04-05 DIAGNOSIS — F411 Generalized anxiety disorder: Secondary | ICD-10-CM

## 2018-04-05 NOTE — Telephone Encounter (Signed)
His blood pressure was very high at his last visit in October. He was supposed to bring in his list of BP's when he came for his labs (which he never did).  We were going to start on a statin medication if his lipids remain elevated.  We cannot properly take care of him and decrease his risk for cardiovascular disease if he doesn't follow up with getting his labs done, and letting us know what his blood pressures are running.  Especially since he may be running low on his metoprolol based on his last refill date. He should have another week left of his lexapro, time for [place] to reach him.  If he comes in for labs and brings his list of BP's in that time frame we can change his lexapro to #90, but at this point, only 30 days until he does what he is supposed to.

## 2018-04-05 NOTE — Telephone Encounter (Signed)
Refilled for #30 on 03/13/18- I called and left message last month asking patient to call and schedule his fasting labs. I called again today and left another message asking patient to please call and schedule fasting lab appt. Do you want me to refill for another #30?

## 2018-04-06 NOTE — Telephone Encounter (Signed)
Message left and rx sent for #30.

## 2018-04-13 ENCOUNTER — Other Ambulatory Visit: Payer: Self-pay | Admitting: Family Medicine

## 2018-04-13 ENCOUNTER — Encounter: Payer: Self-pay | Admitting: Family Medicine

## 2018-04-13 NOTE — Telephone Encounter (Signed)
Still has not scheduled anything or returned any of my calls. Refill for #30?

## 2018-05-08 ENCOUNTER — Other Ambulatory Visit: Payer: Self-pay | Admitting: Family Medicine

## 2018-05-08 DIAGNOSIS — F411 Generalized anxiety disorder: Secondary | ICD-10-CM

## 2018-05-08 NOTE — Telephone Encounter (Signed)
Is this okay to refill? 

## 2018-05-09 ENCOUNTER — Other Ambulatory Visit: Payer: Self-pay | Admitting: Family Medicine

## 2018-06-01 ENCOUNTER — Other Ambulatory Visit: Payer: Self-pay | Admitting: Family Medicine

## 2018-06-01 DIAGNOSIS — F411 Generalized anxiety disorder: Secondary | ICD-10-CM

## 2018-06-27 ENCOUNTER — Other Ambulatory Visit: Payer: Self-pay | Admitting: Family Medicine

## 2018-06-27 MED ORDER — METOPROLOL SUCCINATE ER 50 MG PO TB24: 50.0000 mg | ORAL_TABLET | Freq: Every day | ORAL | 0 refills | Status: DC

## 2018-07-01 ENCOUNTER — Telehealth: Payer: Self-pay | Admitting: Physician Assistant

## 2018-07-01 DIAGNOSIS — L255 Unspecified contact dermatitis due to plants, except food: Secondary | ICD-10-CM

## 2018-07-01 MED ORDER — PREDNISONE 20 MG PO TABS: ORAL_TABLET | ORAL | 0 refills | Status: AC

## 2018-07-01 NOTE — Progress Notes (Signed)
We are sorry that you are not feeing well.  Here is how we plan to help!  Based on what you have shared with me it looks like you have had an allergic reaction to the oily resin from a group of plants.  This resin is very sticky, so it easily attaches to your skin, clothing, tools equipment, and pet's fur.    This blistering rash is often called poison ivy rash although it can come from contact with the leaves, stems and roots of poison ivy, poison oak and poison sumac.  The oily resin contains urushiol (u-ROO-she-ol) that produces a skin rash on exposed skin.  The severity of the rash depends on the amount of urushiol that gets on your skin.  A section of skin with more urushiol on it may develop a rash sooner.  The rash usually develops 12-48 hours after exposure and can last two to three weeks.  Your skin must come in direct contact with the plant's oil to be affected.  Blister fluid doesn't spread the rash.  However, if you come into contact with a piece of clothing or pet fur that has urushiol on it, the rash may spread out.  You can also transfer the oil to other parts of your body with your fingers.  Often the rash looks like a straight line because of the way the plant brushes against your skin.    I have developed the following plan to treat your condition Since your rash is widespread or has resulted in a large number of blisters, I have prescribed an oral corticosteroid.  Please follow these recommendations:  I have sent a prednisone dose pack to your chosen pharmacy. Be sure to follow the instructions carefully and complete the entire prescription. You may use Benadryl or Caladryl topical lotions to sooth the itch and remember cool, not hot, showers and baths can help relieve the itching!  Place cool, wet compresses on the affected area for 15-30 minutes several times a day.  You may also take oral antihistamines, such as diphenhydramine (Benadryl, others), which may also help you sleep  better.  Watch your skin for any purulent (pus) drainage or red streaking from the site.  If this occurs, contact your provider.  You may require an antibiotic for a skin infection.  Make sure that the clothes you were wearing as well as any towels or sheets that may have come in contact with the oil (urushiol) are washed in detergent and hot water.         What can you do to prevent this rash?  Avoid the plants.  Learn how to identify poison ivy, poison oak and poison sumac in all seasons.  When hiking or engaging in other activities that might expose you to these plants, try to stay on cleared pathways.  If camping, make sure you pitch your tent in an area free of these plants.  Keep pets from running through wooded areas so that urushiol doesn't accidentally stick to their fur, which you may touch.  Remove or kill the plants.  In your yard, you can get rid of poison ivy by applying an herbicide or pulling it out of the ground, including the roots, while wearing heavy gloves.  Afterward remove the gloves and thoroughly wash them and your hands.  Don't burn poison ivy or related plants because the urushiol can be carried by smoke.  Wear protective clothing.  If needed, protect your skin by wearing socks, boots, pants, long   sleeves and vinyl gloves.  Wash your skin right away.  Washing off the oil with soap and water within 30 minutes of exposure may reduce your chances of getting a poison ivy rash.  Even washing after an hour or so can help reduce the severity of the rash.  If you walk through some poison ivy and then later touch your shoes, you may get some urushiol on your hands, which may then transfer to your face or body by touching or rubbing.  If the contaminated object isn't cleaned, the urushiol on it can still cause a skin reaction years later.    Be careful not to reuse towels after you have washed your skin.  Also carefully wash clothing in detergent and hot water to remove all traces of  the oil.  Handle contaminated clothing carefully so you don't transfer the urushiol to yourself, furniture, rugs or appliances.  Remember that pets can carry the oil on their fur and paws.  If you think your pet may be contaminated with urushiol, put on some long rubber gloves and give your pet a bath.  Finally, be careful not to burn these plants as the smoke can contain traces of the oil.  Inhaling the smoke may result in difficulty breathing. If that occurred you should see a physician as soon as possible.  See your doctor right away if:  The reaction is severe or widespread You inhaled the smoke from burning poison ivy and are having difficulty breathing Your skin continues to swell The rash affects your eyes, mouth or genitals Blisters are oozing pus You develop a fever greater than 100 F (37.8 C) The rash doesn't get better within a few weeks.  If you scratch the poison ivy rash, bacteria under your fingernails may cause the skin to become infected.  See your doctor if pus starts oozing from the blisters.  Treatment generally includes antibiotics.  Poison ivy treatments are usually limited to self-care methods.  And the rash typically goes away on its own in two to three weeks.     If the rash is widespread or results in a large number of blisters, your doctor may prescribe an oral corticosteroid, such as prednisone.  If a bacterial infection has developed at the rash site, your doctor may give you a prescription for an oral antibiotic.  MAKE SURE YOU  Understand these instructions. Will watch your condition. Will get help right away if you are not doing well or get worse.  Thank you for choosing an e-visit. Your e-visit answers were reviewed by a board certified advanced clinical practitioner to complete your personal care plan. Depending upon the condition, your plan could have included both over the counter or prescription medications.  Please review your pharmacy choice. If  there is a problem you may use MyChart messaging to have the prescription routed to another pharmacy.   Your safety is important to [place]. If you have drug allergies check your prescription carefully.  You can use MyChart to ask questions about today's visit, request a non-urgent call back, or ask for a work or school excuse for 24 hours related to this e-Visit. If it has been greater than 24 hours you will need to follow up with your provider, or enter a new e-Visit to address those concerns.   You will get an email in the next two days asking about your experience. I hope that your e-visit has been valuable and will speed your recovery Thank you for choosing  an e-visit.      ===View-only below this line===   ----- Message -----    From: Sherlon Handing    Sent: 07/01/2018  1:04 PM EDT      To: E-Visit Mailing List Subject: E-Visit Submission: Poison Ivy  E-Visit Submission: Poison Ivy --------------------------------  Question: Which best describes the location of your rash? Answer:   On multiple sites including my face and genital areas  Question: What best describes your rash? Answer:   A large area of red lines, streaks, and blisters that itch  Question: Were you recently outside? Answer:   Yes  Question: If YES, Were you possibly exposed to poison ivy, oak or sumac leaves? Answer:   Yes  Question: If NO, Do you have a pet that was out of doors? Answer:     Question: Has anyone in your family been out of doors and you have handled their clothes, shoes, equipment or towels? Answer:   No  Question: Have you been outside near anyone burning brush (yard debris) Answer:   No  Question: If YES, Are you abnormally wheezing or short of breath since that exposure? Answer:   No  Question: How long was it between the time you were exposed and the apppearance of the rash? Answer:   About 2 days  Question: How long has your rash been present? Answer:   Less than a  week  Question: Have you used any over the counter medications for your rash? Answer:   No  Question: Which of the following OTC Medications have you used? Elta Guadeloupe all that apply) Answer:   Calamine Lotion  Question: Have you developed a fever? Answer:   No  Question: Is the rash spreading? Answer:   Yes  Question: Have you scratched the rash and broken the skin or blisters? Answer:   Yes  Question: If YES, Has pus (milky purulent drainage) developed from or around the blisters? Answer:   Yes  Question: Can you tolerate oral prednisone? Answer:   No  Question: Do you have any other comments or concerns for your provider? Answer:   I am not sure about the prednisone. I was clearing away poison ivy. I wore long sleeves, long pants, gloves and still caught it.  Question: Are you able to attach a photograph of the rash? Answer:   No  Question: Please list your medication allergies that you may have ? (If 'none' , please list as 'none') Answer:   none, I am allergic to shellfish.  Question: Please list any additional comments  Answer:     A total of 5-10 minutes was spent evaluating this patients questionnaire and formulating a plan of care.

## 2018-07-21 ENCOUNTER — Other Ambulatory Visit: Payer: Self-pay | Admitting: Family Medicine

## 2018-07-27 ENCOUNTER — Telehealth: Payer: Self-pay | Admitting: Physician Assistant

## 2018-07-27 DIAGNOSIS — Z20822 Contact with and (suspected) exposure to covid-19: Secondary | ICD-10-CM

## 2018-07-27 NOTE — Progress Notes (Signed)
E-Visit for Corona Virus Screening   Based on your current symptoms, you may very well have the virus, however your symptoms are mild. Currently, not all patients are being tested. If the symptoms are mild and there is not a known exposure, performing the test is not indicated.  You have been enrolled in [facility] for COVID-19. Daily you will receive a questionnaire within the [facility] website. Our COVID-19 response team will be monitoring your responses daily.   Coronavirus disease 2019 (COVID-19) is a respiratory illness that can spread from person to person. The virus that causes COVID-19 is a new virus that was first identified in the country of [place] but is now found in multiple other countries and has spread to the [place].  Symptoms associated with the virus are mild to severe fever, cough, and shortness of breath. There is currently no vaccine to protect against COVID-19, and there is no specific antiviral treatment for the virus.   To be considered HIGH RISK for Coronavirus (COVID-19), you have to meet the following criteria:  . Traveled to [place], [place], [place], [place] or [place]; or in the [place] to [place], [place], [place], or [place]; and have fever, cough, and shortness of breath within the last 2 weeks of travel OR  . Been in close contact with a person diagnosed with COVID-19 within the last 2 weeks and have fever, cough, and shortness of breath  . IF YOU DO NOT MEET THESE CRITERIA, YOU ARE CONSIDERED LOW RISK FOR COVID-19.   It is vitally important that if you feel that you have an infection such as this virus or any other virus that you stay home and away from places where you may spread it to others.  You should self-quarantine for 14 days if you have symptoms that could potentially be coronavirus and avoid contact with people age 57 and older.    You may also take acetaminophen (Tylenol) as needed for fever.   Reduce your risk of any  infection by using the same precautions used for avoiding the common cold or flu:  Marland Kitchen Wash your hands often with soap and warm water for at least 20 seconds.  If soap and water are not readily available, use an alcohol-based hand sanitizer with at least 60% alcohol.  . If coughing or sneezing, cover your mouth and nose by coughing or sneezing into the elbow areas of your shirt or coat, into a tissue or into your sleeve (not your hands). . Avoid shaking hands with others and consider head nods or verbal greetings only. . Avoid touching your eyes, nose, or mouth with unwashed hands.  . Avoid close contact with people who are sick. . Avoid places or events with large numbers of people in one location, like concerts or sporting events. . Carefully consider travel plans you have or are making. . If you are planning any travel outside or inside the [place], visit the CDC's Travelers' Health webpage for the latest health notices. . If you have some symptoms but not all symptoms, continue to monitor at home and seek medical attention if your symptoms worsen. . If you are having a medical emergency, call 911.  HOME CARE . Only take medications as instructed by your medical team. . Drink plenty of fluids and get plenty of rest. . A steam or ultrasonic humidifier can help if you have congestion.   GET HELP RIGHT AWAY IF: . You develop worsening fever. . You become  short of breath . You cough up blood. . Your symptoms become more severe MAKE SURE YOU   Understand these instructions.  Will watch your condition.  Will get help right away if you are not doing well or get worse.  Your e-visit answers were reviewed by a board certified advanced clinical practitioner to complete your personal care plan.  Depending on the condition, your plan could have included both over the counter or prescription medications.  If there is a problem please reply once you have received a response from your provider. Your  safety is important to [place].  If you have drug allergies check your prescription carefully.    You can use MyChart to ask questions about today's visit, request a non-urgent call back, or ask for a work or school excuse for 24 hours related to this e-Visit. If it has been greater than 24 hours you will need to follow up with your provider, or enter a new e-Visit to address those concerns. You will get an e-mail in the next two days asking about your experience.  I hope that your e-visit has been valuable and will speed your recovery. Thank you for using e-visits.

## 2018-07-27 NOTE — Progress Notes (Signed)
I have spent 5 minutes in review of e-visit questionnaire, review and updating patient chart, medical decision making and response to patient.   Shonya Sumida Cody Yuli Lanigan, PA-C    

## 2018-08-16 ENCOUNTER — Other Ambulatory Visit: Payer: Self-pay | Admitting: Family Medicine

## 2018-08-16 NOTE — Telephone Encounter (Signed)
He still hasn't done his labs that were ordered in October.  It has been a year and a half since his last labs, and with high blood pressure, needs it done yearly. Only refill #30, no refills, and advise him of the need to schedule fasting labs. At some point, we will no longer refill his meds if he doesn't come for these, and may not be able to continue to care for him.

## 2018-08-16 NOTE — Telephone Encounter (Signed)
Patient advised.

## 2018-08-16 NOTE — Telephone Encounter (Signed)
Is this okay to refill? 

## 2018-09-12 ENCOUNTER — Other Ambulatory Visit: Payer: Self-pay | Admitting: Family Medicine

## 2018-09-12 DIAGNOSIS — F411 Generalized anxiety disorder: Secondary | ICD-10-CM

## 2018-09-12 NOTE — Telephone Encounter (Signed)
[facility] to [facility]apro and metoprolol. Pt was advised he needs a nurse visit for blood work and made an appt 10-10-18 . Please advise if this is ok to fill for thirty more days. Center

## 2018-09-12 NOTE — Telephone Encounter (Signed)
These were labs that were ordered at his physical in 11/2017.  At this point, he is also long past due for a med check (for his HTN). You can refill 30d of each, but he ALSO needs to schedule a med check visit within 1-2 weeks of getting those labs done (can be as soon as the next day) since he doesn't even have a CPE scheduled for October and is past due for f/u on HTN.  Please remind him to bring in his BP monitor so that we can assess the accuracy of his monitor. (he was supposed to do that in 11/2017).  If he does not come for that visit, he will NOT get any more medications, and we may not be able to continue to care for him (without him coming for visits and getting the appropriate labs).

## 2018-10-09 ENCOUNTER — Other Ambulatory Visit: Payer: Self-pay | Admitting: Family Medicine

## 2018-10-09 DIAGNOSIS — F411 Generalized anxiety disorder: Secondary | ICD-10-CM

## 2018-10-09 NOTE — Telephone Encounter (Signed)
He is scheduled for 10/23/2018, should I just fill for #30?

## 2018-10-10 ENCOUNTER — Other Ambulatory Visit: Payer: Self-pay

## 2018-10-18 ENCOUNTER — Encounter: Payer: Self-pay | Admitting: Family Medicine

## 2018-10-19 ENCOUNTER — Other Ambulatory Visit: Payer: Self-pay

## 2018-10-23 ENCOUNTER — Encounter: Payer: Self-pay | Admitting: Family Medicine

## 2018-10-27 ENCOUNTER — Other Ambulatory Visit: Payer: Self-pay

## 2018-11-02 ENCOUNTER — Encounter: Payer: Self-pay | Admitting: Family Medicine

## 2018-11-08 ENCOUNTER — Encounter: Payer: Self-pay | Admitting: Family Medicine

## 2018-11-08 ENCOUNTER — Other Ambulatory Visit: Payer: Self-pay | Admitting: Family Medicine

## 2018-11-08 ENCOUNTER — Other Ambulatory Visit: Payer: BLUE CROSS/BLUE SHIELD

## 2018-11-08 DIAGNOSIS — F411 Generalized anxiety disorder: Secondary | ICD-10-CM

## 2018-11-08 NOTE — Telephone Encounter (Signed)
Is this okay to refill? I see pt has been dismissed

## 2018-11-13 ENCOUNTER — Encounter: Payer: Self-pay | Admitting: Family Medicine

## 2018-11-13 MED ORDER — METOPROLOL SUCCINATE ER 50 MG PO TB24: 50.0000 mg | ORAL_TABLET | Freq: Every day | ORAL | 0 refills | Status: DC

## 2018-12-09 ENCOUNTER — Other Ambulatory Visit: Payer: Self-pay | Admitting: Family Medicine

## 2018-12-09 DIAGNOSIS — F411 Generalized anxiety disorder: Secondary | ICD-10-CM

## 2018-12-11 ENCOUNTER — Telehealth: Payer: Self-pay | Admitting: Family Medicine

## 2018-12-11 NOTE — Telephone Encounter (Signed)
Pt. Is requesting a refill on lexapro 10 [facility] [address]. But pt. Was dismissed from our office back on 11/13/18

## 2018-12-11 NOTE — Telephone Encounter (Signed)
I saw the refill request before I saw this message. I already denied it, but yes, it is fine to give another 30 day supply so that he doesn't run out before his appointment.  Please send in prescription for 30 days for his lexapro.  Thanks

## 2018-12-11 NOTE — Telephone Encounter (Signed)
Pt left message he will be out of Lexapro before his new pcp visit in November.  Will you please send refill?

## 2018-12-11 NOTE — Telephone Encounter (Signed)
Has been more than 30 days since dismissal.  Deny

## 2018-12-17 ENCOUNTER — Other Ambulatory Visit: Payer: Self-pay | Admitting: Family Medicine

## 2018-12-17 DIAGNOSIS — F411 Generalized anxiety disorder: Secondary | ICD-10-CM

## 2018-12-18 ENCOUNTER — Other Ambulatory Visit: Payer: Self-pay | Admitting: Family Medicine

## 2018-12-18 DIAGNOSIS — F411 Generalized anxiety disorder: Secondary | ICD-10-CM

## 2018-12-18 MED ORDER — ESCITALOPRAM OXALATE 10 MG PO TABS: 10.0000 mg | ORAL_TABLET | Freq: Every day | ORAL | 0 refills | Status: DC

## 2019-02-08 ENCOUNTER — Other Ambulatory Visit: Payer: Self-pay | Admitting: Family Medicine

## 2019-02-08 DIAGNOSIS — F411 Generalized anxiety disorder: Secondary | ICD-10-CM

## 2022-05-25 ENCOUNTER — Emergency Department (HOSPITAL_COMMUNITY): Payer: No Typology Code available for payment source

## 2022-05-25 ENCOUNTER — Other Ambulatory Visit: Payer: Self-pay

## 2022-05-25 ENCOUNTER — Inpatient Hospital Stay (HOSPITAL_COMMUNITY)
Admission: EM | Admit: 2022-05-25 | Discharge: 2022-05-27 | DRG: 894 | Discharge status: Against Medical Advice | Payer: No Typology Code available for payment source | Attending: Internal Medicine | Admitting: Internal Medicine

## 2022-05-25 DIAGNOSIS — F10939 Alcohol use, unspecified with withdrawal, unspecified: Secondary | ICD-10-CM | POA: Diagnosis present

## 2022-05-25 DIAGNOSIS — E669 Obesity, unspecified: Secondary | ICD-10-CM | POA: Diagnosis present

## 2022-05-25 DIAGNOSIS — F10221 Alcohol dependence with intoxication delirium: Secondary | ICD-10-CM | POA: Diagnosis not present

## 2022-05-25 DIAGNOSIS — R000 Tachycardia, unspecified: Secondary | ICD-10-CM | POA: Insufficient documentation

## 2022-05-25 DIAGNOSIS — Z91013 Allergy to seafood: Secondary | ICD-10-CM

## 2022-05-25 DIAGNOSIS — Y92410 Unspecified street and highway as the place of occurrence of the external cause: Secondary | ICD-10-CM

## 2022-05-25 DIAGNOSIS — S0101XA Laceration without foreign body of scalp, initial encounter: Secondary | ICD-10-CM

## 2022-05-25 DIAGNOSIS — I10 Essential (primary) hypertension: Secondary | ICD-10-CM | POA: Diagnosis present

## 2022-05-25 DIAGNOSIS — Z6835 Body mass index (BMI) 35.0-35.9, adult: Secondary | ICD-10-CM

## 2022-05-25 DIAGNOSIS — R748 Abnormal levels of other serum enzymes: Secondary | ICD-10-CM | POA: Insufficient documentation

## 2022-05-25 DIAGNOSIS — E785 Hyperlipidemia, unspecified: Secondary | ICD-10-CM | POA: Diagnosis present

## 2022-05-25 DIAGNOSIS — E8729 Other acidosis: Secondary | ICD-10-CM | POA: Insufficient documentation

## 2022-05-25 DIAGNOSIS — E872 Acidosis, unspecified: Secondary | ICD-10-CM | POA: Diagnosis present

## 2022-05-25 DIAGNOSIS — Z85828 Personal history of other malignant neoplasm of skin: Secondary | ICD-10-CM

## 2022-05-25 DIAGNOSIS — Z79899 Other long term (current) drug therapy: Secondary | ICD-10-CM

## 2022-05-25 DIAGNOSIS — D72829 Elevated white blood cell count, unspecified: Secondary | ICD-10-CM | POA: Diagnosis present

## 2022-05-25 DIAGNOSIS — F1090 Alcohol use, unspecified, uncomplicated: Secondary | ICD-10-CM | POA: Insufficient documentation

## 2022-05-25 DIAGNOSIS — F10921 Alcohol use, unspecified with intoxication delirium: Secondary | ICD-10-CM

## 2022-05-25 DIAGNOSIS — K219 Gastro-esophageal reflux disease without esophagitis: Secondary | ICD-10-CM | POA: Diagnosis present

## 2022-05-25 DIAGNOSIS — Z825 Family history of asthma and other chronic lower respiratory diseases: Secondary | ICD-10-CM

## 2022-05-25 DIAGNOSIS — Z23 Encounter for immunization: Secondary | ICD-10-CM

## 2022-05-25 DIAGNOSIS — F411 Generalized anxiety disorder: Secondary | ICD-10-CM | POA: Diagnosis present

## 2022-05-25 DIAGNOSIS — Z87891 Personal history of nicotine dependence: Secondary | ICD-10-CM

## 2022-05-25 DIAGNOSIS — Y908 Blood alcohol level of 240 mg/100 ml or more: Secondary | ICD-10-CM | POA: Diagnosis present

## 2022-05-25 DIAGNOSIS — K700 Alcoholic fatty liver: Secondary | ICD-10-CM | POA: Insufficient documentation

## 2022-05-25 DIAGNOSIS — I480 Paroxysmal atrial fibrillation: Secondary | ICD-10-CM | POA: Diagnosis present

## 2022-05-25 LAB — COMPREHENSIVE METABOLIC PANEL
ALT: 76 U/L — ABNORMAL HIGH (ref 0–44)
AST: 95 U/L — ABNORMAL HIGH (ref 15–41)
Albumin: 3.9 g/dL (ref 3.5–5.0)
Alkaline Phosphatase: 59 U/L (ref 38–126)
Anion gap: 18 — ABNORMAL HIGH (ref 5–15)
BUN: 9 mg/dL (ref 6–20)
CO2: 18 mmol/L — ABNORMAL LOW (ref 22–32)
Calcium: 8.5 mg/dL — ABNORMAL LOW (ref 8.9–10.3)
Chloride: 99 mmol/L (ref 98–111)
Creatinine, Ser: 0.93 mg/dL (ref 0.61–1.24)
GFR, Estimated: >60 mL/min (ref >60)
Glucose, Bld: 109 mg/dL — ABNORMAL HIGH (ref 70–99)
Potassium: 3.6 mmol/L (ref 3.5–5.1)
Sodium: 135 mmol/L (ref 135–145)
Total Bilirubin: 0.7 mg/dL (ref 0.3–1.2)
Total Protein: 6.7 g/dL (ref 6.5–8.1)

## 2022-05-25 LAB — I-STAT CHEM 8, ED
BUN: 9 mg/dL (ref 6–20)
Calcium, Ion: 0.95 mmol/L — ABNORMAL LOW (ref 1.15–1.40)
Chloride: 102 mmol/L (ref 98–111)
Creatinine, Ser: 1.5 mg/dL — ABNORMAL HIGH (ref 0.61–1.24)
Glucose, Bld: 112 mg/dL — ABNORMAL HIGH (ref 70–99)
HCT: 44 % (ref 39.0–52.0)
Hemoglobin: 15 g/dL (ref 13.0–17.0)
Potassium: 3.5 mmol/L (ref 3.5–5.1)
Sodium: 136 mmol/L (ref 135–145)
TCO2: 21 mmol/L — ABNORMAL LOW (ref 22–32)

## 2022-05-25 LAB — CBC
HCT: 41.4 % (ref 39.0–52.0)
Hemoglobin: 14.7 g/dL (ref 13.0–17.0)
MCH: 34.6 pg — ABNORMAL HIGH (ref 26.0–34.0)
MCHC: 35.5 g/dL (ref 30.0–36.0)
MCV: 97.4 fL (ref 80.0–100.0)
Platelets: 230 10*3/uL (ref 150–400)
RBC: 4.25 MIL/uL (ref 4.22–5.81)
RDW: 14.1 % (ref 11.5–15.5)
WBC: 11.2 10*3/uL — ABNORMAL HIGH (ref 4.0–10.5)
nRBC: 0 % (ref 0.0–0.2)

## 2022-05-25 LAB — PROTIME-INR
INR: 1 (ref 0.8–1.2)
Prothrombin Time: 13 seconds (ref 11.4–15.2)

## 2022-05-25 LAB — LACTIC ACID, PLASMA: Lactic Acid, Venous: 3.9 mmol/L (ref 0.5–1.9)

## 2022-05-25 LAB — ETHANOL: Alcohol, Ethyl (B): 381 mg/dL (ref <10)

## 2022-05-25 MED ORDER — TETANUS-DIPHTH-ACELL PERTUSSIS 5-2.5-18.5 LF-MCG/0.5 IM SUSY
0.5000 mL | PREFILLED_SYRINGE | Freq: Once | INTRAMUSCULAR | Status: AC
  Administered 2022-05-25: 0.5 mL via INTRAMUSCULAR

## 2022-05-25 MED ORDER — FENTANYL CITRATE PF 50 MCG/ML IJ SOSY
100.0000 ug | PREFILLED_SYRINGE | Freq: Once | INTRAMUSCULAR | Status: AC
  Administered 2022-05-25: 100 ug via INTRAVENOUS
  Filled 2022-05-25 (×2): qty 2

## 2022-05-25 MED ORDER — LORAZEPAM 2 MG/ML IJ SOLN
1.0000 mg | Freq: Once | INTRAMUSCULAR | Status: AC | PRN
  Administered 2022-05-25: 1 mg via INTRAVENOUS
  Filled 2022-05-25: qty 1

## 2022-05-25 MED ORDER — IOHEXOL 350 MG/ML SOLN
150.0000 mL | Freq: Once | INTRAVENOUS | Status: AC | PRN
  Administered 2022-05-25: 150 mL via INTRAVENOUS

## 2022-05-25 MED ORDER — CEFAZOLIN SODIUM-DEXTROSE 2-4 GM/100ML-% IV SOLN
2.0000 g | Freq: Once | INTRAVENOUS | Status: AC
  Administered 2022-05-25: 2 g via INTRAVENOUS

## 2022-05-25 MED ORDER — HALOPERIDOL LACTATE 5 MG/ML IJ SOLN
2.0000 mg | Freq: Once | INTRAMUSCULAR | Status: AC
  Administered 2022-05-25: 2 mg via INTRAVENOUS
  Filled 2022-05-25: qty 1
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
## 2022-05-25 NOTE — TOC CAGE-AID Note (Signed)
Transition of Care [facility]) - CAGE-AID Screening   Patient Details  Name: Eddie Harris MRN: [id number] Date of Birth: 15-Jun-1960  Transition of Care (TOC) CM/SW Contact:    Army Melia, RN Phone Number:[phone] 05/25/2022, 8:02 PM    CAGE-AID Screening:    Have You Ever Felt You Ought to Cut Down on Your Drinking or Drug Use?: No Have People Annoyed You By Critizing Your Drinking Or Drug Use?: No Have You Felt Bad Or Guilty About Your Drinking Or Drug Use?: No Have You Ever Had a Drink or Used Drugs First Thing In The Morning to Steady Your Nerves or to Get Rid of a Hangover?: No CAGE-AID Score: 0  Substance Abuse Education Offered: Yes (pt reports he drinks 2-3 drinks daily and states he doesn't get hangover symptoms. Alcohol level 381 on admission, would benefit from resources/TOC consult)
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
## 2022-05-25 NOTE — ED Notes (Signed)
Pt to CT
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
## 2022-05-25 NOTE — ED Notes (Signed)
Patient transported to CT 
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
## 2022-05-26 ENCOUNTER — Other Ambulatory Visit: Payer: Self-pay

## 2022-05-26 ENCOUNTER — Encounter (HOSPITAL_COMMUNITY): Payer: Self-pay | Admitting: Internal Medicine

## 2022-05-26 DIAGNOSIS — S0101XA Laceration without foreign body of scalp, initial encounter: Secondary | ICD-10-CM | POA: Diagnosis present

## 2022-05-26 DIAGNOSIS — I480 Paroxysmal atrial fibrillation: Secondary | ICD-10-CM | POA: Diagnosis present

## 2022-05-26 DIAGNOSIS — K219 Gastro-esophageal reflux disease without esophagitis: Secondary | ICD-10-CM | POA: Diagnosis present

## 2022-05-26 DIAGNOSIS — Z6835 Body mass index (BMI) 35.0-35.9, adult: Secondary | ICD-10-CM | POA: Diagnosis not present

## 2022-05-26 DIAGNOSIS — Y908 Blood alcohol level of 240 mg/100 ml or more: Secondary | ICD-10-CM | POA: Diagnosis present

## 2022-05-26 DIAGNOSIS — D72829 Elevated white blood cell count, unspecified: Secondary | ICD-10-CM | POA: Diagnosis present

## 2022-05-26 DIAGNOSIS — Z23 Encounter for immunization: Secondary | ICD-10-CM | POA: Diagnosis not present

## 2022-05-26 DIAGNOSIS — E669 Obesity, unspecified: Secondary | ICD-10-CM | POA: Diagnosis present

## 2022-05-26 DIAGNOSIS — F10939 Alcohol use, unspecified with withdrawal, unspecified: Secondary | ICD-10-CM | POA: Diagnosis present

## 2022-05-26 DIAGNOSIS — K700 Alcoholic fatty liver: Secondary | ICD-10-CM | POA: Diagnosis present

## 2022-05-26 DIAGNOSIS — I10 Essential (primary) hypertension: Secondary | ICD-10-CM | POA: Diagnosis present

## 2022-05-26 DIAGNOSIS — E8729 Other acidosis: Secondary | ICD-10-CM | POA: Insufficient documentation

## 2022-05-26 DIAGNOSIS — F1090 Alcohol use, unspecified, uncomplicated: Secondary | ICD-10-CM | POA: Insufficient documentation

## 2022-05-26 DIAGNOSIS — R000 Tachycardia, unspecified: Secondary | ICD-10-CM | POA: Insufficient documentation

## 2022-05-26 DIAGNOSIS — E872 Acidosis, unspecified: Secondary | ICD-10-CM | POA: Diagnosis present

## 2022-05-26 DIAGNOSIS — Z85828 Personal history of other malignant neoplasm of skin: Secondary | ICD-10-CM | POA: Diagnosis not present

## 2022-05-26 DIAGNOSIS — Z91013 Allergy to seafood: Secondary | ICD-10-CM | POA: Diagnosis not present

## 2022-05-26 DIAGNOSIS — F10221 Alcohol dependence with intoxication delirium: Secondary | ICD-10-CM | POA: Diagnosis present

## 2022-05-26 DIAGNOSIS — Y92410 Unspecified street and highway as the place of occurrence of the external cause: Secondary | ICD-10-CM | POA: Diagnosis not present

## 2022-05-26 DIAGNOSIS — Z79899 Other long term (current) drug therapy: Secondary | ICD-10-CM | POA: Diagnosis not present

## 2022-05-26 DIAGNOSIS — E785 Hyperlipidemia, unspecified: Secondary | ICD-10-CM | POA: Diagnosis present

## 2022-05-26 DIAGNOSIS — Z825 Family history of asthma and other chronic lower respiratory diseases: Secondary | ICD-10-CM | POA: Diagnosis not present

## 2022-05-26 DIAGNOSIS — F411 Generalized anxiety disorder: Secondary | ICD-10-CM | POA: Diagnosis present

## 2022-05-26 DIAGNOSIS — R748 Abnormal levels of other serum enzymes: Secondary | ICD-10-CM | POA: Insufficient documentation

## 2022-05-26 DIAGNOSIS — Z87891 Personal history of nicotine dependence: Secondary | ICD-10-CM | POA: Diagnosis not present

## 2022-05-26 LAB — COMPREHENSIVE METABOLIC PANEL
ALT: 67 U/L — ABNORMAL HIGH (ref 0–44)
AST: 72 U/L — ABNORMAL HIGH (ref 15–41)
Albumin: 3.9 g/dL (ref 3.5–5.0)
Alkaline Phosphatase: 58 U/L (ref 38–126)
Anion gap: 18 — ABNORMAL HIGH (ref 5–15)
BUN: 5 mg/dL — ABNORMAL LOW (ref 6–20)
CO2: 17 mmol/L — ABNORMAL LOW (ref 22–32)
Calcium: 8.6 mg/dL — ABNORMAL LOW (ref 8.9–10.3)
Chloride: 101 mmol/L (ref 98–111)
Creatinine, Ser: 0.88 mg/dL (ref 0.61–1.24)
GFR, Estimated: >60 mL/min (ref >60)
Glucose, Bld: 98 mg/dL (ref 70–99)
Potassium: 3.5 mmol/L (ref 3.5–5.1)
Sodium: 136 mmol/L (ref 135–145)
Total Bilirubin: 1.1 mg/dL (ref 0.3–1.2)
Total Protein: 6.9 g/dL (ref 6.5–8.1)

## 2022-05-26 LAB — RAPID URINE DRUG SCREEN, HOSP PERFORMED
Amphetamines: NOT DETECTED
Barbiturates: NOT DETECTED
Benzodiazepines: NOT DETECTED
Cocaine: NOT DETECTED
Opiates: NOT DETECTED
Tetrahydrocannabinol: POSITIVE — AB

## 2022-05-26 LAB — SAMPLE TO BLOOD BANK

## 2022-05-26 LAB — URINALYSIS, ROUTINE W REFLEX MICROSCOPIC
Bilirubin Urine: NEGATIVE
Glucose, UA: NEGATIVE mg/dL
Hgb urine dipstick: NEGATIVE
Ketones, ur: 20 mg/dL — AB
Leukocytes,Ua: NEGATIVE
Nitrite: NEGATIVE
Protein, ur: NEGATIVE mg/dL
Specific Gravity, Urine: 1.029 (ref 1.005–1.030)
pH: 5 (ref 5.0–8.0)

## 2022-05-26 LAB — CBC WITH DIFFERENTIAL/PLATELET
Abs Immature Granulocytes: 0.04 10*3/uL (ref 0.00–0.07)
Basophils Absolute: 0.1 10*3/uL (ref 0.0–0.1)
Basophils Relative: 1 %
Eosinophils Absolute: 0 10*3/uL (ref 0.0–0.5)
Eosinophils Relative: 0 %
HCT: 39.3 % (ref 39.0–52.0)
Hemoglobin: 14.3 g/dL (ref 13.0–17.0)
Immature Granulocytes: 0 %
Lymphocytes Relative: 23 %
Lymphs Abs: 2.4 10*3/uL (ref 0.7–4.0)
MCH: 35.2 pg — ABNORMAL HIGH (ref 26.0–34.0)
MCHC: 36.4 g/dL — ABNORMAL HIGH (ref 30.0–36.0)
MCV: 96.8 fL (ref 80.0–100.0)
Monocytes Absolute: 1 10*3/uL (ref 0.1–1.0)
Monocytes Relative: 10 %
Neutro Abs: 7 10*3/uL (ref 1.7–7.7)
Neutrophils Relative %: 66 %
Platelets: 212 10*3/uL (ref 150–400)
RBC: 4.06 MIL/uL — ABNORMAL LOW (ref 4.22–5.81)
RDW: 14.2 % (ref 11.5–15.5)
WBC: 10.5 10*3/uL (ref 4.0–10.5)
nRBC: 0 % (ref 0.0–0.2)

## 2022-05-26 LAB — HIV ANTIBODY (ROUTINE TESTING W REFLEX): HIV Screen 4th Generation wRfx: NONREACTIVE

## 2022-05-26 LAB — LACTIC ACID, PLASMA
Lactic Acid, Venous: 3.7 mmol/L (ref 0.5–1.9)
Lactic Acid, Venous: 1.3 mmol/L (ref 0.5–1.9)

## 2022-05-26 MED ORDER — ACETAMINOPHEN 325 MG PO TABS
650.0000 mg | ORAL_TABLET | Freq: Four times a day (QID) | ORAL | Status: DC | PRN
  Administered 2022-05-26 – 2022-05-27 (×4): 650 mg via ORAL
  Filled 2022-05-26 (×4): qty 2

## 2022-05-26 MED ORDER — SODIUM CHLORIDE 0.9 % IV BOLUS
1000.0000 mL | Freq: Once | INTRAVENOUS | Status: AC
  Administered 2022-05-26: 1000 mL via INTRAVENOUS

## 2022-05-26 MED ORDER — METOPROLOL SUCCINATE ER 100 MG PO TB24
100.0000 mg | ORAL_TABLET | Freq: Every day | ORAL | Status: DC
  Administered 2022-05-26 – 2022-05-27 (×2): 100 mg via ORAL
  Filled 2022-05-26 (×2): qty 1

## 2022-05-26 MED ORDER — ESCITALOPRAM OXALATE 20 MG PO TABS
20.0000 mg | ORAL_TABLET | Freq: Every day | ORAL | Status: DC
  Administered 2022-05-26 – 2022-05-27 (×2): 20 mg via ORAL
  Filled 2022-05-26 (×2): qty 1

## 2022-05-26 MED ORDER — SODIUM CHLORIDE 0.9 % IV SOLN: INTRAVENOUS | Status: AC

## 2022-05-26 MED ORDER — AMLODIPINE BESYLATE 5 MG PO TABS
5.0000 mg | ORAL_TABLET | Freq: Every day | ORAL | Status: DC
  Administered 2022-05-26 – 2022-05-27 (×2): 5 mg via ORAL
  Filled 2022-05-26 (×2): qty 1

## 2022-05-26 MED ORDER — THIAMINE MONONITRATE 100 MG PO TABS
100.0000 mg | ORAL_TABLET | Freq: Every day | ORAL | Status: DC
  Administered 2022-05-26: 100 mg via ORAL
  Filled 2022-05-26: qty 1

## 2022-05-26 MED ORDER — AMLODIPINE BESYLATE 5 MG PO TABS: 5.0000 mg | ORAL_TABLET | Freq: Every day | ORAL | Status: DC

## 2022-05-26 MED ORDER — FOLIC ACID 1 MG PO TABS
1.0000 mg | ORAL_TABLET | Freq: Every day | ORAL | Status: DC
  Administered 2022-05-26 – 2022-05-27 (×2): 1 mg via ORAL
  Filled 2022-05-26 (×2): qty 1

## 2022-05-26 MED ORDER — ACETAMINOPHEN 650 MG RE SUPP: 650.0000 mg | Freq: Four times a day (QID) | RECTAL | Status: DC | PRN

## 2022-05-26 MED ORDER — CALCIUM GLUCONATE-NACL 2-0.675 GM/100ML-% IV SOLN
2.0000 g | Freq: Once | INTRAVENOUS | Status: AC
  Administered 2022-05-26: 2000 mg via INTRAVENOUS
  Filled 2022-05-26: qty 100

## 2022-05-26 MED ORDER — THIAMINE MONONITRATE 100 MG PO TABS
500.0000 mg | ORAL_TABLET | Freq: Every day | ORAL | Status: DC
  Administered 2022-05-26 – 2022-05-27 (×2): 500 mg via ORAL
  Filled 2022-05-26 (×2): qty 5

## 2022-05-26 MED ORDER — LORAZEPAM 2 MG/ML IJ SOLN
1.0000 mg | INTRAMUSCULAR | Status: DC | PRN
  Administered 2022-05-26: 1 mg via INTRAVENOUS
  Administered 2022-05-26: 2 mg via INTRAVENOUS
  Administered 2022-05-26: 1 mg via INTRAVENOUS
  Administered 2022-05-27 (×4): 2 mg via INTRAVENOUS
  Filled 2022-05-26: qty 1
  Filled 2022-05-26: qty 2
  Filled 2022-05-26 (×4): qty 1

## 2022-05-26 MED ORDER — ENOXAPARIN SODIUM 40 MG/0.4ML IJ SOSY
40.0000 mg | PREFILLED_SYRINGE | INTRAMUSCULAR | Status: DC
  Administered 2022-05-26: 40 mg via SUBCUTANEOUS
  Filled 2022-05-26: qty 0.4

## 2022-05-26 MED ORDER — LORAZEPAM 1 MG PO TABS
1.0000 mg | ORAL_TABLET | ORAL | Status: DC | PRN
  Administered 2022-05-26 (×3): 2 mg via ORAL
  Filled 2022-05-26 (×3): qty 2
  Filled 2022-05-26: qty 1

## 2022-05-26 MED ORDER — ADULT MULTIVITAMIN W/MINERALS CH
1.0000 | ORAL_TABLET | Freq: Every day | ORAL | Status: DC
  Administered 2022-05-26 – 2022-05-27 (×2): 1 via ORAL
  Filled 2022-05-26 (×2): qty 1

## 2022-05-26 MED ORDER — TRAMADOL HCL 50 MG PO TABS: 50.0000 mg | ORAL_TABLET | Freq: Four times a day (QID) | ORAL | Status: DC | PRN

## 2022-05-26 MED ORDER — THIAMINE HCL 100 MG/ML IJ SOLN: 100.0000 mg | Freq: Every day | INTRAMUSCULAR | Status: DC

## 2022-05-26 NOTE — Plan of Care (Signed)
  Problem: Safety: Goal: Non-violent Restraint(s) Outcome: Progressing   Problem: Education: Goal: Knowledge of General Education information will improve Description: Including pain rating scale, medication(s)/side effects and non-pharmacologic comfort measures Outcome: Progressing   Problem: Health Behavior/Discharge Planning: Goal: Ability to manage health-related needs will improve Outcome: Progressing   

## 2022-05-26 NOTE — ED Notes (Signed)
Internal Med page per MD   Betsey Holiday
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
## 2022-05-26 NOTE — Progress Notes (Signed)
Pt admitted for Detox. Cleared by taruma. Intial CIWA of 13 treated with 2mg  ativan po. Follow up of 12 with 2mg  ativan given. Pt A&Ox4, BP (!) 172/100   Pulse (!) 104   Temp 98.7 F (37.1 C) (Oral)   Resp (!) 28   SpO2 98%  Maintains HTN even after scheduled meds. Pt c/o headache. Pt on tele tachy. Bed in lowest position, 2/4 rails up, bed alarm on, bed locked and call bell near by. Eddie Harris 05/26/22 ,12:50 PM
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
## 2022-05-26 NOTE — Consult Note (Addendum)
[facility] Nurse Consult Note: Reason for Consult: Consult requested for left scalp wound.  Pt was assessed by the trauma team in the ED and they did not find an area which required sutures and recommended WOC consult for topical treatment recommendations. Head CT scan indicates, " Left temporal and parietal scalp hematoma, with numerous retained radiopaque foreign bodies within the scalp." Wound type: Left anterior scalp with full thickness abrasion, affected area is approx 8X14X.2cm with narrow skin bridges in between the scattered full thickness wounds, which are red and moist.  Mod amt old dark red clotted blood to middle deepest area of wound. Attempted to cleanse affected areas and remove any pieces of glass using a warm washcloth and hydrogen peroxide.  I could not see or feel any pieces to remove; they may be underneath the surface of the skin.  Dressing procedure/placement/frequency: Topical treatment orders provided for bedside nurses to perform as follows: Cleanse left scalp wounds Q day with peroxide and warm washcloth, pat dry, then apply double-folded Xeroform gauze and ABD pad and kerlex to hold in place. Pt should shower and wash his hair after discharge to continue to cleanse the affected area before applying the dressing.  Please re-consult if further assistance is needed.  Thank-you,  Julien Girt MSN, [place], [place], [place], [place]
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
## 2022-05-26 NOTE — ED Notes (Signed)
Wife updated as to pt condition and plan of care. She will be coming by at some point.
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
## 2022-05-27 DIAGNOSIS — F10239 Alcohol dependence with withdrawal, unspecified: Secondary | ICD-10-CM

## 2022-05-27 DIAGNOSIS — S0101XA Laceration without foreign body of scalp, initial encounter: Secondary | ICD-10-CM

## 2022-05-27 LAB — BASIC METABOLIC PANEL
Anion gap: 9 (ref 5–15)
BUN: 7 mg/dL (ref 6–20)
CO2: 21 mmol/L — ABNORMAL LOW (ref 22–32)
Calcium: 8.5 mg/dL — ABNORMAL LOW (ref 8.9–10.3)
Chloride: 101 mmol/L (ref 98–111)
Creatinine, Ser: 0.93 mg/dL (ref 0.61–1.24)
GFR, Estimated: >60 mL/min (ref >60)
Glucose, Bld: 110 mg/dL — ABNORMAL HIGH (ref 70–99)
Potassium: 3.3 mmol/L — ABNORMAL LOW (ref 3.5–5.1)
Sodium: 131 mmol/L — ABNORMAL LOW (ref 135–145)

## 2022-05-27 LAB — CBC
HCT: 37.5 % — ABNORMAL LOW (ref 39.0–52.0)
Hemoglobin: 13.4 g/dL (ref 13.0–17.0)
MCH: 34.4 pg — ABNORMAL HIGH (ref 26.0–34.0)
MCHC: 35.7 g/dL (ref 30.0–36.0)
MCV: 96.2 fL (ref 80.0–100.0)
Platelets: 162 10*3/uL (ref 150–400)
RBC: 3.9 MIL/uL — ABNORMAL LOW (ref 4.22–5.81)
RDW: 14.3 % (ref 11.5–15.5)
WBC: 7.6 10*3/uL (ref 4.0–10.5)
nRBC: 0 % (ref 0.0–0.2)

## 2022-05-27 LAB — MAGNESIUM: Magnesium: 1.7 mg/dL (ref 1.7–2.4)

## 2022-05-27 LAB — GLUCOSE, CAPILLARY: Glucose-Capillary: 105 mg/dL — ABNORMAL HIGH (ref 70–99)

## 2022-05-27 MED ORDER — POTASSIUM CHLORIDE CRYS ER 20 MEQ PO TBCR
40.0000 meq | EXTENDED_RELEASE_TABLET | Freq: Once | ORAL | Status: AC
  Administered 2022-05-27: 40 meq via ORAL
  Filled 2022-05-27: qty 2

## 2022-05-27 MED ORDER — ENOXAPARIN SODIUM 60 MG/0.6ML IJ SOSY
0.5000 mg/kg | PREFILLED_SYRINGE | INTRAMUSCULAR | Status: DC
  Administered 2022-05-27: 55 mg via SUBCUTANEOUS
  Filled 2022-05-27: qty 0.6

## 2022-05-27 MED ORDER — CHLORDIAZEPOXIDE HCL 25 MG PO CAPS: ORAL_CAPSULE | ORAL | 0 refills | Status: AC

## 2022-05-27 NOTE — Progress Notes (Signed)
   05/27/22 0730  Assess: MEWS Score  Temp 97.8 F (36.6 C)  BP (!) 174/112  MAP (mmHg) 129  Pulse Rate (!) 125  Resp 17  SpO2 99 %  O2 Device Room Air  Assess: MEWS Score  MEWS Temp 0  MEWS Systolic 0  MEWS Pulse 2  MEWS RR 0  MEWS LOC 0  MEWS Score 2  MEWS Score Color Yellow  Assess: if the MEWS score is Yellow or Red  Were vital signs taken at a resting state? Yes  Focused Assessment No change from prior assessment  Does the patient meet 2 or more of the SIRS criteria? No  MEWS guidelines implemented  No, vital signs rechecked  Notify: Charge Nurse/RN  Name of Charge Nurse/RN Notified Hulan Amato, Rn  Assess: SIRS CRITERIA  SIRS Temperature  0  SIRS Pulse 1  SIRS Respirations  0  SIRS WBC 0  SIRS Score Sum  1   Will continue to observe pts pulse and BP. Pt on CIWA protocol for ativan administration.

## 2022-05-27 NOTE — Progress Notes (Signed)
Pt more agitated and jumped up to the bathroom, pulling off head dressing. Dr. Posey Pronto notified and came up to talk with pt. Ativan 2 mg IV given based on CIWA score. Pt states he wants to go home. Wife and sister came up to room. Dr. Posey Pronto came back into the room to discuss with pt and family members.  Pt adamant about going "home."  Pt was informed that he could have seizures from withdrawal. AMA paperwork filled out and signed.

## 2022-05-27 NOTE — Progress Notes (Signed)
Pt left the department accompanied by sister.
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
## 2022-05-27 NOTE — Progress Notes (Signed)
Dr. Posey Pronto put in Rx for [facility] for [facility] [address]. Sister with pt and will pick it up.
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
## 2023-09-27 ENCOUNTER — Observation Stay (HOSPITAL_COMMUNITY): Payer: Self-pay

## 2023-09-27 ENCOUNTER — Encounter (HOSPITAL_COMMUNITY): Payer: Self-pay | Admitting: Emergency Medicine

## 2023-09-27 ENCOUNTER — Inpatient Hospital Stay (HOSPITAL_COMMUNITY)
Admission: EM | Admit: 2023-09-27 | Discharge: 2023-10-04 | DRG: 083 | Disposition: A | Discharge status: Home / Self Care | Payer: Self-pay | Attending: Internal Medicine | Admitting: Internal Medicine

## 2023-09-27 ENCOUNTER — Emergency Department (HOSPITAL_COMMUNITY): Payer: Self-pay

## 2023-09-27 ENCOUNTER — Other Ambulatory Visit: Payer: Self-pay

## 2023-09-27 DIAGNOSIS — Y907 Blood alcohol level of 200-239 mg/100 ml: Secondary | ICD-10-CM | POA: Diagnosis present

## 2023-09-27 DIAGNOSIS — Z87891 Personal history of nicotine dependence: Secondary | ICD-10-CM

## 2023-09-27 DIAGNOSIS — M25562 Pain in left knee: Principal | ICD-10-CM | POA: Diagnosis present

## 2023-09-27 DIAGNOSIS — Z79899 Other long term (current) drug therapy: Secondary | ICD-10-CM

## 2023-09-27 DIAGNOSIS — Y9301 Activity, walking, marching and hiking: Secondary | ICD-10-CM | POA: Diagnosis present

## 2023-09-27 DIAGNOSIS — W1830XA Fall on same level, unspecified, initial encounter: Secondary | ICD-10-CM | POA: Diagnosis present

## 2023-09-27 DIAGNOSIS — W010XXA Fall on same level from slipping, tripping and stumbling without subsequent striking against object, initial encounter: Secondary | ICD-10-CM | POA: Diagnosis present

## 2023-09-27 DIAGNOSIS — F1020 Alcohol dependence, uncomplicated: Secondary | ICD-10-CM | POA: Diagnosis present

## 2023-09-27 DIAGNOSIS — Z6832 Body mass index (BMI) 32.0-32.9, adult: Secondary | ICD-10-CM

## 2023-09-27 DIAGNOSIS — F32A Depression, unspecified: Secondary | ICD-10-CM | POA: Diagnosis present

## 2023-09-27 DIAGNOSIS — R270 Ataxia, unspecified: Secondary | ICD-10-CM | POA: Diagnosis present

## 2023-09-27 DIAGNOSIS — M6282 Rhabdomyolysis: Secondary | ICD-10-CM | POA: Diagnosis present

## 2023-09-27 DIAGNOSIS — Y92238 Other place in hospital as the place of occurrence of the external cause: Secondary | ICD-10-CM | POA: Diagnosis present

## 2023-09-27 DIAGNOSIS — I480 Paroxysmal atrial fibrillation: Secondary | ICD-10-CM | POA: Diagnosis present

## 2023-09-27 DIAGNOSIS — Z91013 Allergy to seafood: Secondary | ICD-10-CM

## 2023-09-27 DIAGNOSIS — I10 Essential (primary) hypertension: Secondary | ICD-10-CM | POA: Diagnosis present

## 2023-09-27 DIAGNOSIS — E785 Hyperlipidemia, unspecified: Secondary | ICD-10-CM | POA: Diagnosis present

## 2023-09-27 DIAGNOSIS — E66811 Obesity, class 1: Secondary | ICD-10-CM | POA: Diagnosis present

## 2023-09-27 DIAGNOSIS — F1090 Alcohol use, unspecified, uncomplicated: Secondary | ICD-10-CM | POA: Diagnosis present

## 2023-09-27 DIAGNOSIS — R5381 Other malaise: Secondary | ICD-10-CM | POA: Diagnosis present

## 2023-09-27 DIAGNOSIS — K700 Alcoholic fatty liver: Secondary | ICD-10-CM | POA: Diagnosis present

## 2023-09-27 DIAGNOSIS — R7989 Other specified abnormal findings of blood chemistry: Secondary | ICD-10-CM | POA: Diagnosis present

## 2023-09-27 DIAGNOSIS — E872 Acidosis, unspecified: Secondary | ICD-10-CM | POA: Diagnosis present

## 2023-09-27 DIAGNOSIS — Y92008 Other place in unspecified non-institutional (private) residence as the place of occurrence of the external cause: Secondary | ICD-10-CM

## 2023-09-27 DIAGNOSIS — Z85828 Personal history of other malignant neoplasm of skin: Secondary | ICD-10-CM

## 2023-09-27 DIAGNOSIS — S065XAA Traumatic subdural hemorrhage with loss of consciousness status unknown, initial encounter: Principal | ICD-10-CM | POA: Diagnosis present

## 2023-09-27 DIAGNOSIS — F4322 Adjustment disorder with anxiety: Secondary | ICD-10-CM | POA: Diagnosis present

## 2023-09-27 DIAGNOSIS — K219 Gastro-esophageal reflux disease without esophagitis: Secondary | ICD-10-CM | POA: Diagnosis present

## 2023-09-27 LAB — CBC WITH DIFFERENTIAL/PLATELET
Abs Immature Granulocytes: 0.1 K/uL — ABNORMAL HIGH (ref 0.00–0.07)
Basophils Absolute: 0 K/uL (ref 0.0–0.1)
Basophils Relative: 0 %
Eosinophils Absolute: 0 K/uL (ref 0.0–0.5)
Eosinophils Relative: 0 %
HCT: 45.9 % (ref 39.0–52.0)
Hemoglobin: 15.7 g/dL (ref 13.0–17.0)
Immature Granulocytes: 1 %
Lymphocytes Relative: 23 %
Lymphs Abs: 2.8 K/uL (ref 0.7–4.0)
MCH: 33.5 pg (ref 26.0–34.0)
MCHC: 34.2 g/dL (ref 30.0–36.0)
MCV: 98.1 fL (ref 80.0–100.0)
Monocytes Absolute: 1.1 K/uL — ABNORMAL HIGH (ref 0.1–1.0)
Monocytes Relative: 9 %
Neutro Abs: 8.1 K/uL — ABNORMAL HIGH (ref 1.7–7.7)
Neutrophils Relative %: 67 %
Platelets: 223 K/uL (ref 150–400)
RBC: 4.68 MIL/uL (ref 4.22–5.81)
RDW: 13 % (ref 11.5–15.5)
WBC: 12.1 K/uL — ABNORMAL HIGH (ref 4.0–10.5)
nRBC: 0 % (ref 0.0–0.2)

## 2023-09-27 LAB — URINALYSIS, COMPLETE (UACMP) WITH MICROSCOPIC
Bacteria, UA: NONE SEEN
Bilirubin Urine: NEGATIVE
Glucose, UA: NEGATIVE mg/dL
Ketones, ur: 20 mg/dL — AB
Leukocytes,Ua: NEGATIVE
Nitrite: NEGATIVE
Protein, ur: NEGATIVE mg/dL
Specific Gravity, Urine: 1.003 — ABNORMAL LOW (ref 1.005–1.030)
pH: 6 (ref 5.0–8.0)

## 2023-09-27 LAB — RAPID URINE DRUG SCREEN, HOSP PERFORMED
Amphetamines: NOT DETECTED
Barbiturates: NOT DETECTED
Benzodiazepines: NOT DETECTED
Cocaine: NOT DETECTED
Opiates: NOT DETECTED
Tetrahydrocannabinol: POSITIVE — AB

## 2023-09-27 LAB — RETICULOCYTES
Immature Retic Fract: 9.4 % (ref 2.3–15.9)
RBC.: 4.67 MIL/uL (ref 4.22–5.81)
Retic Count, Absolute: 89.2 K/uL (ref 19.0–186.0)
Retic Ct Pct: 1.9 % (ref 0.4–3.1)

## 2023-09-27 LAB — COMPREHENSIVE METABOLIC PANEL WITH GFR
ALT: 57 U/L — ABNORMAL HIGH (ref 0–44)
AST: 71 U/L — ABNORMAL HIGH (ref 15–41)
Albumin: 4.3 g/dL (ref 3.5–5.0)
Alkaline Phosphatase: 55 U/L (ref 38–126)
Anion gap: 17 — ABNORMAL HIGH (ref 5–15)
BUN: 6 mg/dL — ABNORMAL LOW (ref 8–23)
CO2: 19 mmol/L — ABNORMAL LOW (ref 22–32)
Calcium: 8.9 mg/dL (ref 8.9–10.3)
Chloride: 99 mmol/L (ref 98–111)
Creatinine, Ser: 0.98 mg/dL (ref 0.61–1.24)
GFR, Estimated: >60 mL/min (ref >60)
Glucose, Bld: 102 mg/dL — ABNORMAL HIGH (ref 70–99)
Potassium: 3.5 mmol/L (ref 3.5–5.1)
Sodium: 135 mmol/L (ref 135–145)
Total Bilirubin: 0.9 mg/dL (ref 0.0–1.2)
Total Protein: 7.7 g/dL (ref 6.5–8.1)

## 2023-09-27 LAB — MAGNESIUM: Magnesium: 2 mg/dL (ref 1.7–2.4)

## 2023-09-27 LAB — PHOSPHORUS: Phosphorus: 2.3 mg/dL — ABNORMAL LOW (ref 2.5–4.6)

## 2023-09-27 LAB — PROTIME-INR
INR: 0.9 (ref 0.8–1.2)
Prothrombin Time: 12.8 s (ref 11.4–15.2)

## 2023-09-27 LAB — TSH: TSH: 2.728 u[IU]/mL (ref 0.350–4.500)

## 2023-09-27 LAB — CK: Total CK: 1006 U/L — ABNORMAL HIGH (ref 49–397)

## 2023-09-27 LAB — AMMONIA: Ammonia: 18 umol/L (ref 9–35)

## 2023-09-27 LAB — ETHANOL: Alcohol, Ethyl (B): 221 mg/dL — ABNORMAL HIGH (ref <15)

## 2023-09-27 MED ORDER — POTASSIUM PHOSPHATES 15 MMOLE/5ML IV SOLN
15.0000 mmol | Freq: Once | INTRAVENOUS | Status: AC
  Administered 2023-09-28: 15 mmol via INTRAVENOUS
  Filled 2023-09-27: qty 5

## 2023-09-27 MED ORDER — SODIUM CHLORIDE 0.9 % IV BOLUS
1000.0000 mL | Freq: Once | INTRAVENOUS | Status: AC
  Administered 2023-09-27: 1000 mL via INTRAVENOUS

## 2023-09-27 MED ORDER — THIAMINE HCL 100 MG/ML IJ SOLN
100.0000 mg | Freq: Every day | INTRAMUSCULAR | Status: DC
  Administered 2023-09-27: 100 mg via INTRAVENOUS
  Filled 2023-09-27: qty 2

## 2023-09-27 MED ORDER — SODIUM CHLORIDE 0.9 % IV SOLN: INTRAVENOUS | Status: DC

## 2023-09-27 NOTE — Assessment & Plan Note (Signed)
 Bilateral subdural hematomas per neurosurgery recommendation to admit to [facility] for observation repeat CT scan at 1 AM today neurochecks every 4 hours

## 2023-09-27 NOTE — Discharge Instructions (Signed)
 Follow up with your family doc in the office.  Follow up with the ortho doc in the office.   Take 4 over the counter ibuprofen tablets 3 times a day or 2 over-the-counter naproxen tablets twice a day for pain. Also take tylenol  1000mg (2 extra strength) four times a day.
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
## 2023-09-27 NOTE — Assessment & Plan Note (Signed)
Monitor for any sign of withdrawal

## 2023-09-27 NOTE — Assessment & Plan Note (Signed)
 Check INR
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
## 2023-09-27 NOTE — Subjective & Objective (Signed)
 Coming from home with a mechanical fall falling onto left knee Has been drinking today could not get back up, denies any head injury Plain imaging was non acute  He fell again in the waiting room  CT head showed bilateral subdural hematoma NS Dr. Darnella rec admit to [facility] for neuro checks repeat CT in 6 H

## 2023-09-27 NOTE — H&P (Signed)
 Eddie Harris FMW:[id number] DOB: 1960/03/16 DOA: 09/27/2023     PCP: Macarthur Elouise SHAUNNA, DO     Patient arrived to ER on 09/27/23 at 1618 Referred by Attending Emil Share, DO   Patient coming from:    home Lives alone,     Chief Complaint:   Chief Complaint  Patient presents with   Fall   Knee Pain    HPI: Eddie Harris is a 63 y.o. male with medical history significant of alcohol use, HTN     Presented with  inability to walk Coming from home with a mechanical fall falling onto left knee Has been drinking today could not get back up, denies any head injury Plain imaging was non acute  He fell again in the waiting room  CT head showed bilateral subdural hematoma NS Dr. Darnella rec admit to [facility] for neuro checks repeat CT in 6 H  Pt states he only drinks 3 days a week but when he does it is up to 6 beers Denies DT's Last head injury was few months ago and was minor   Reports he noted that he had hard time walking today but he also has bad lrft knee and unsure if that is related, denies any upper extremity weakness, no slurred speech  He did drink today    significant ETOH intake   Does not smoke       Regarding pertinent Chronic problems:       HTN on norvasc  and metoprolol       While in ER:    CT shwed bilateral subdural NS has been consultedc     Lab Orders         CBC with Differential         Ethanol         Comprehensive metabolic panel     Knee 1. No fracture or dislocation of the left knee. 2. Peripheral vascular disease. CT HEAD Mixed density subdural hematoma along the left anterior and lateral convexity measuring up to 15 mm maximum thickness with mass effect on the underlying left frontal lobe. About 2 mm shift to the right anteriorly. 2. Trace right anterior convexity subdural hematoma measuring 4 mm. Suspected trace amount of subdural blood along the right anterior falx measuring 2 mm in maximum thickness.   MRI brain  ordered  CXR -  NON  acute    Following Medications were ordered in ER: Medications  sodium chloride  0.9 % bolus 1,000 mL (1,000 mLs Intravenous New Bag/Given 09/27/23 2015)    _______________________________________________________ ER Provider Called:      NS Dr. Darnella They Recommend admit to medicine    Admit to [facility] repeat ct     ED Triage Vitals  Encounter Vitals Group     BP 09/27/23 1632 131/84     Girls Systolic BP Percentile --      Girls Diastolic BP Percentile --      Boys Systolic BP Percentile --      Boys Diastolic BP Percentile --      Pulse Rate 09/27/23 1632 99     Resp 09/27/23 1632 20     Temp 09/27/23 1632 98 F (36.7 C)     Temp Source 09/27/23 1632 Oral     SpO2 09/27/23 1632 99 %     Weight --      Height --      Head Circumference --      Peak Flow --  Pain Score 09/27/23 1627 6     Pain Loc --      Pain Education --      Exclude from Growth Chart --   UFJK(75)@     _________________________________________ Significant initial  Findings: Abnormal Labs Reviewed  CBC WITH DIFFERENTIAL/PLATELET - Abnormal; Notable for the following components:      Result Value   WBC 12.1 (*)    Neutro Abs 8.1 (*)    Monocytes Absolute 1.1 (*)    Abs Immature Granulocytes 0.10 (*)    All other components within normal limits  ETHANOL - Abnormal; Notable for the following components:   Alcohol, Ethyl (B) 221 (*)    All other components within normal limits  COMPREHENSIVE METABOLIC PANEL WITH GFR - Abnormal; Notable for the following components:   CO2 19 (*)    Glucose, Bld 102 (*)    BUN 6 (*)    AST 71 (*)    ALT 57 (*)    Anion gap 17 (*)    All other components within normal limits       Cardiac Panel (last 3 results) Recent Labs    09/27/23 2223  CKTOTAL 1,006*     ECG: Ordered   The recent clinical data is shown below. Vitals:   09/27/23 1632 09/27/23 1937  BP: 131/84 124/84  Pulse: 99 (!) 104  Resp: 20 19  Temp: 98 F (36.7 C) 98.2 F (36.8 C)   TempSrc: Oral Oral  SpO2: 99% 100%    WBC     Component Value Date/Time   WBC 12.1 (H) 09/27/2023 2020   LYMPHSABS 2.8 09/27/2023 2020   MONOABS 1.1 (H) 09/27/2023 2020   EOSABS 0.0 09/27/2023 2020   BASOSABS 0.0 09/27/2023 2020     __________________________________________________________ Recent Labs  Lab 09/27/23 2020  NA 135  K 3.5  CO2 19*  GLUCOSE 102*  BUN 6*  CREATININE 0.98  CALCIUM  8.9    Cr   stable,  Lab Results  Component Value Date   CREATININE 0.98 09/27/2023   CREATININE 0.93 05/27/2022   CREATININE 0.88 05/26/2022    Recent Labs  Lab 09/27/23 2020  AST 71*  ALT 57*  ALKPHOS 55  BILITOT 0.9  PROT 7.7  ALBUMIN 4.3   Lab Results  Component Value Date   CALCIUM  8.9 09/27/2023    Plt: Lab Results  Component Value Date   PLT 223 09/27/2023       Recent Labs  Lab 09/27/23 2020  WBC 12.1*  NEUTROABS 8.1*  HGB 15.7  HCT 45.9  MCV 98.1  PLT 223    HG/HCT  stable,      Component Value Date/Time   HGB 15.7 09/27/2023 2020   HCT 45.9 09/27/2023 2020   MCV 98.1 09/27/2023 2020     _______________________________________________ Hospitalist was called for admission for   Acute pain of left knee  SDH  ataxia   The following Work up has been ordered so far:  Orders Placed This Encounter  Procedures   Critical Care   DG Knee Complete 4 Views Left   CT Head Wo Contrast   CT Head Wo Contrast   CBC with Differential   Ethanol   Comprehensive metabolic panel   Ambulate in hall   Apply knee immobilizer   Crutches   Consult to neurosurgery   Consult to hospitalist     OTHER Significant initial  Findings:  labs showing:     DM  labs:  HbA1C:  No results for input(s): HGBA1C in the last 8760 hours.     CBG (last 3)  No results for input(s): GLUCAP in the last 72 hours.        Cultures: No results found for: SDES, SPECREQUEST, CULT, REPTSTATUS   Radiological Exams on Admission: DG CHEST PORT 1  VIEW Result Date: 09/27/2023 CLINICAL DATA:  Fall EXAM: PORTABLE CHEST 1 VIEW COMPARISON:  05/25/2022 FINDINGS: The heart size and mediastinal contours are within normal limits. Both lungs are clear. The visualized skeletal structures are unremarkable. Hiatal hernia IMPRESSION: No active disease. Hiatal hernia. Electronically Signed   By: Luke Bun M.D.   On: 09/27/2023 22:47   CT Head Wo Contrast Result Date: 09/27/2023 CLINICAL DATA:  Head trauma abnormal mental status EXAM: CT HEAD WITHOUT CONTRAST TECHNIQUE: Contiguous axial images were obtained from the base of the skull through the vertex without intravenous contrast. RADIATION DOSE REDUCTION: This exam was performed according to the departmental dose-optimization program which includes automated exposure control, adjustment of the mA and/or kV according to patient size and/or use of iterative reconstruction technique. COMPARISON:  CT brain 05/25/2022 FINDINGS: Brain: No acute territorial infarction or intracranial mass. Mixed density subdural hematoma along the left anterior and lateral convexity, this measures 15 mm maximum thickness on sagittal series 6, image 35. Small amounts of slightly dense more acute appearing blood within the subdural, series 2, image 23. Mass effect on the underlying left frontal lobe. There is trace right anterior convexity more isodense appearing subdural collection measuring 4 mm on series 2, image 20. Suspected trace amount of subdural blood along the right anterior falx measuring 2 mm in maximum thickness on series 2, image 12. Overall stable ventricle size. About 2 mm shift to the right anteriorly. Vascular: No hyperdense vessels.  Carotid vascular calcification Skull: No fracture Sinuses/Orbits: No acute finding. Other: None Traumatic Brain Injury Risk Stratification Skull Fracture: No - Low/mBIG 1 Subdural Hematoma (SDH): 8mm plus - High/mBIG 3 Subarachnoid Hemorrhage [facility]): No Epidural Hematoma (EDH): No - Low/mBIG 1  Cerebral contusion, intra-axial, intraparenchymal Hemorrhage (IPH): No Intraventricular Hemorrhage (IVH): No - Low/mBIG 1 Midline Shift > 1mm or Edema/effacement of sulci/vents: Yes - High/mBIG 3 ---------------------------------------------------- IMPRESSION: 1. Mixed density subdural hematoma along the left anterior and lateral convexity measuring up to 15 mm maximum thickness with mass effect on the underlying left frontal lobe. About 2 mm shift to the right anteriorly. 2. Trace right anterior convexity subdural hematoma measuring 4 mm. Suspected trace amount of subdural blood along the right anterior falx measuring 2 mm in maximum thickness. Critical Value/emergent results were called by telephone at the time of interpretation on 09/27/2023 at 9:04 pm to provider DAN FLOYD , who verbally acknowledged these results. Electronically Signed   By: Luke Bun M.D.   On: 09/27/2023 21:04   DG Knee Complete 4 Views Left Result Date: 09/27/2023 CLINICAL DATA:  Knee pain after fall. EXAM: LEFT KNEE - COMPLETE 4+ VIEW COMPARISON:  None Available. FINDINGS: No evidence of fracture, dislocation, or joint effusion. No evidence of arthropathy or other focal bone abnormality. Peripheral vascular calcifications. IMPRESSION: 1. No fracture or dislocation of the left knee. 2. Peripheral vascular disease. Electronically Signed   By: Andrea Gasman M.D.   On: 09/27/2023 18:20   _______________________________________________________________________________________________________ Latest  Blood pressure 124/84, pulse (!) 104, temperature 98.2 F (36.8 C), temperature source Oral, resp. rate 19, SpO2 100%.   Vitals  labs and radiology finding personally reviewed  Review of Systems:  Pertinent positives include: ataxia  Constitutional:  No weight loss, night sweats, Fevers, chills, fatigue, weight loss  HEENT:  No headaches, Difficulty swallowing,Tooth/dental problems,Sore throat,  No sneezing, itching, ear ache,  nasal congestion, post nasal drip,  Cardio-vascular:  No chest pain, Orthopnea, PND, anasarca, dizziness, palpitations.no Bilateral lower extremity swelling  GI:  No heartburn, indigestion, abdominal pain, nausea, vomiting, diarrhea, change in bowel habits, loss of appetite, melena, blood in stool, hematemesis Resp:  no shortness of breath at rest. No dyspnea on exertion, No excess mucus, no productive cough, No non-productive cough, No coughing up of blood.No change in color of mucus.No wheezing. Skin:  no rash or lesions. No jaundice GU:  no dysuria, change in color of urine, no urgency or frequency. No straining to urinate.  No flank pain.  Musculoskeletal:  No joint pain or no joint swelling. No decreased range of motion. No back pain.  Psych:  No change in mood or affect. No depression or anxiety. No memory loss.  Neuro: no localizing neurological complaints, no tingling, no weakness, no double vision, no gait abnormality, no slurred speech, no confusion  All systems reviewed and apart from HOPI all are negative _______________________________________________________________________________________________ Past Medical History:   Past Medical History:  Diagnosis Date   Anxiety    Atrial fibrillation (HCC) 02/22/1998   negative cardiac workup (Dr. Elpidio).  Episode occurred after heavy drinking   Elevated blood pressure reading without diagnosis of hypertension 02/24/2011   GERD (gastroesophageal reflux disease)    silent--EGD Dr. Luis; pt denies reflux, saw duodenal irritation on EGD   Hyperlipidemia    Mild obesity    Ocular migraine    Dr. Alm Pinal   Paroxysmal atrial fibrillation (HCC)    PVC (premature ventricular contraction)    Pyloric stenosis    as an infant; vs hernia repair--both documented in his chart   Skin cancer, basal cell    Dr. Lemond Ill      Past Surgical History:  Procedure Laterality Date   basal cell skin cancer excision     HERNIA  REPAIR  age 40 weeks   ?diaphragmatic given age    Social History:  Ambulatory   independently     reports that he quit smoking about 36 years ago. He has never used smokeless tobacco. He reports that he does not drink alcohol and does not use drugs.    Family History:   Family History  Adopted: Yes  Problem Relation Age of Onset   Asthma Son    ______________________________________________________________________________________________ Allergies: Allergies  Allergen Reactions   Shellfish-Derived Products Anaphylaxis and Nausea And Vomiting     Prior to Admission medications   Medication Sig Start Date End Date Taking? Authorizing Provider  ALPHA LIPOIC ACID PO Take 1-2 tablets by mouth daily.    [provider]  amLODipine  (NORVASC ) 5 MG tablet Take 5 mg by mouth at bedtime. 02/04/22   [provider]  Coenzyme Q10 10 MG capsule Take 10 mg by mouth daily.    [provider]  escitalopram  (LEXAPRO ) 10 MG tablet Take 1 tablet (10 mg total) by mouth daily. Patient taking differently: Take 20 mg by mouth daily. 12/18/18   Randol Dawes, MD  Lactobacillus (ACIDOPHILUS PO) Take 1 capsule by mouth at bedtime.    [provider]  MAGNESIUM PO Take 1 tablet by mouth daily.    [provider]  metoprolol  succinate (TOPROL -XL) 50 MG 24 hr tablet Take 1 tablet (50 mg total) by mouth  daily. Take with or immediately following a meal. Patient taking differently: Take 100 mg by mouth daily. Take with or immediately following a meal. 11/13/18   Randol Dawes, MD  OMEGA-3 FATTY ACIDS PO Take 2,400 g by mouth daily.    [provider]  vitamin C (ASCORBIC ACID) 500 MG tablet Take 500 mg by mouth daily. Reported on 07/10/2015    [provider]    ___________________________________________________________________________________________________ Physical Exam:    09/27/2023    7:37 PM 09/27/2023    4:32 PM 05/27/2022   12:41 PM  Vitals  with BMI  Systolic 124 131 839   160  Diastolic 84 84 102   102  Pulse 104 99 98   98     1. General:  in No  Acute distress    Chronically ill   -appearing 2. Psychological: Alert and   Oriented 3. Head/ENT:    Dry Mucous Membranes                          Head Non traumatic, neck supple                    Poor Dentition 4. SKIN:  decreased Skin turgor,  Skin clean Dry and intact no rash    5. Heart: Regular rate and rhythm no  Murmur, no Rub or gallop 6. Lungs: no wheezes or crackles   7. Abdomen: Soft,  non-tender, Non distended bowel sounds present 8. Lower extremities: no clubbing, cyanosis, no  edema 9. Neurologically strength 5 out of 5 in all 4 extremities cranial nerves II through XII intact 10. MSK: Normal range of motion    Chart has been reviewed  ______________________________________________________________________________________________  Assessment/Plan 63 y.o. male with medical history significant of alcohol use, HTN   Admitted for Acute pain of left knee  SDH  ataxia    Present on Admission:  Subdural hematoma (HCC)  Alcohol use disorder  Essential hypertension, benign  Fatty liver, alcoholic  Rhabdomyolysis    Alcohol use disorder Monitor for any sign of withdrawal  Essential hypertension, benign Restart Toprol  when able to tolerate  Fatty liver, alcoholic Check INR  Subdural hematoma (HCC) Bilateral subdural hematomas per neurosurgery recommendation to admit to [facility] for observation repeat CT scan at 1 AM today neurochecks every 4 hours  Rhabdomyolysis Will rehydrate and follow renal function  Ataxia In the setting of EtOh and  bilateral subdural hematoma Ordered MRI brain Thiamin and B 12 level  Of MRI abnormal will need neurology consult      Other plan as per orders.  DVT prophylaxis:  SCD    Code Status:    Code Status: Prior FULL CODE   as per patient   I had personally discussed CODE STATUS with patient   ACP    none  Family Communication:   Family not at  Bedside    Diet  heart healthy   Disposition Plan:     To home once workup is complete and patient is stable   Following barriers for discharge:                             Repeat ct done         Consult Orders  (From admission, onward)           Start     Ordered   09/27/23 2124  Consult  to hospitalist  Once       Provider:  (Not yet assigned)  Question Answer Comment  Place call to: [facility]   Reason for Consult Admit      09/27/23 2123                               Would benefit from PT/OT eval prior to DC  Ordered                      Consults called: NS is aware   Admission status:  ED Disposition     ED Disposition  Admit   Condition  --   Comment  Hospital Area: [facility] [[id number]]  Level of Care: Progressive [102]  Admit to Progressive based on following criteria: NEUROLOGICAL AND NEUROSURGICAL complex patients with significant risk of instability, who do not meet ICU criteria, yet require close observation or frequent assessment (< / = every 2 - 4 hours) with medical / nursing intervention.  May place patient in observation at [facility] or [facility] if equivalent level of care is available:: No  Covid Evaluation: Asymptomatic - no recent exposure (last 10 days) testing not required  Diagnosis: Subdural hematoma [facility]) [[id number]]  Admitting Physician: Missael Ferrari [[id number]]  Attending Physician: Arneisha Kincannon [[id number]]  For patients discharging to extended facilities (i.e. SNF, AL, group homes or LTAC) initiate:: Discharge to SNF/Facility Placement COVID-19 Lab Testing Protocol           Obs      Level of care      progressive        Javen Hinderliter 09/27/2023, 11:38 PM    [facility]     after 2 AM please page floor coverage   If 7AM-7PM, please contact the day team taking care of the patient using [email or web address]

## 2023-09-27 NOTE — ED Provider Notes (Signed)
 [place] EMERGENCY DEPARTMENT AT [facility] Provider Note   CSN: [id number] Arrival date & time: 09/27/23  1618     Patient presents with: Fall and Knee Pain   Eddie Harris is a 63 y.o. male.   63 yo M with a chief complaints of a fall.  Patient said he had multiple drinks this morning and he fell to the ground and was unable to get up and walk on his own and EMS brought him here for evaluation.  He denies pain anywhere.  States he just could not walk earlier because he was too drunk.  Feels better now and thinks he can ambulate fine.  Denies head injury denies confusion denies vomiting.   Fall  Knee Pain      Prior to Admission medications   Medication Sig Start Date End Date Taking? Authorizing Provider  ALPHA LIPOIC ACID PO Take 1-2 tablets by mouth daily.    [provider]  amLODipine  (NORVASC ) 5 MG tablet Take 5 mg by mouth at bedtime. 02/04/22   [provider]  Coenzyme Q10 10 MG capsule Take 10 mg by mouth daily.    [provider]  escitalopram  (LEXAPRO ) 10 MG tablet Take 1 tablet (10 mg total) by mouth daily. Patient taking differently: Take 20 mg by mouth daily. 12/18/18   Randol Dawes, MD  Lactobacillus (ACIDOPHILUS PO) Take 1 capsule by mouth at bedtime.    [provider]  MAGNESIUM PO Take 1 tablet by mouth daily.    [provider]  metoprolol  succinate (TOPROL -XL) 50 MG 24 hr tablet Take 1 tablet (50 mg total) by mouth daily. Take with or immediately following a meal. Patient taking differently: Take 100 mg by mouth daily. Take with or immediately following a meal. 11/13/18   Randol Dawes, MD  OMEGA-3 FATTY ACIDS PO Take 2,400 g by mouth daily.    [provider]  vitamin C (ASCORBIC ACID) 500 MG tablet Take 500 mg by mouth daily. Reported on 07/10/2015    [provider]    Allergies: Shellfish-derived products    Review of Systems  Updated Vital Signs BP 124/84 (BP Location: Right  Arm)   Pulse (!) 104   Temp 98.2 F (36.8 C) (Oral)   Resp 19   SpO2 100%   Physical Exam Vitals and nursing note reviewed.  Constitutional:      Appearance: He is well-developed.  HENT:     Head: Normocephalic and atraumatic.  Eyes:     Pupils: Pupils are equal, round, and reactive to light.  Neck:     Vascular: No JVD.  Cardiovascular:     Rate and Rhythm: Normal rate and regular rhythm.     Heart sounds: No murmur heard.    No friction rub. No gallop.  Pulmonary:     Effort: No respiratory distress.     Breath sounds: No wheezing.  Abdominal:     General: There is no distension.     Tenderness: There is no abdominal tenderness. There is no guarding or rebound.  Musculoskeletal:        General: Normal range of motion.     Cervical back: Normal range of motion and neck supple.     Comments: Full range of motion of the left knee without obvious discomfort.  Skin:    Coloration: Skin is not pale.     Findings: No rash.  Neurological:     Mental Status: He is alert and oriented to  person, place, and time.  Psychiatric:        Behavior: Behavior normal.     (all labs ordered are listed, but only abnormal results are displayed) Labs Reviewed  CBC WITH DIFFERENTIAL/PLATELET - Abnormal; Notable for the following components:      Result Value   WBC 12.1 (*)    Neutro Abs 8.1 (*)    Monocytes Absolute 1.1 (*)    Abs Immature Granulocytes 0.10 (*)    All other components within normal limits  ETHANOL - Abnormal; Notable for the following components:   Alcohol, Ethyl (B) 221 (*)    All other components within normal limits  COMPREHENSIVE METABOLIC PANEL WITH GFR - Abnormal; Notable for the following components:   CO2 19 (*)    Glucose, Bld 102 (*)    BUN 6 (*)    AST 71 (*)    ALT 57 (*)    Anion gap 17 (*)    All other components within normal limits    EKG: None  Radiology: CT Head Wo Contrast Result Date: 09/27/2023 CLINICAL DATA:  Head trauma abnormal  mental status EXAM: CT HEAD WITHOUT CONTRAST TECHNIQUE: Contiguous axial images were obtained from the base of the skull through the vertex without intravenous contrast. RADIATION DOSE REDUCTION: This exam was performed according to the departmental dose-optimization program which includes automated exposure control, adjustment of the mA and/or kV according to patient size and/or use of iterative reconstruction technique. COMPARISON:  CT brain 05/25/2022 FINDINGS: Brain: No acute territorial infarction or intracranial mass. Mixed density subdural hematoma along the left anterior and lateral convexity, this measures 15 mm maximum thickness on sagittal series 6, image 35. Small amounts of slightly dense more acute appearing blood within the subdural, series 2, image 23. Mass effect on the underlying left frontal lobe. There is trace right anterior convexity more isodense appearing subdural collection measuring 4 mm on series 2, image 20. Suspected trace amount of subdural blood along the right anterior falx measuring 2 mm in maximum thickness on series 2, image 12. Overall stable ventricle size. About 2 mm shift to the right anteriorly. Vascular: No hyperdense vessels.  Carotid vascular calcification Skull: No fracture Sinuses/Orbits: No acute finding. Other: None Traumatic Brain Injury Risk Stratification Skull Fracture: No - Low/mBIG 1 Subdural Hematoma (SDH): 8mm plus - High/mBIG 3 Subarachnoid Hemorrhage [facility]): No Epidural Hematoma (EDH): No - Low/mBIG 1 Cerebral contusion, intra-axial, intraparenchymal Hemorrhage (IPH): No Intraventricular Hemorrhage (IVH): No - Low/mBIG 1 Midline Shift > 1mm or Edema/effacement of sulci/vents: Yes - High/mBIG 3 ---------------------------------------------------- IMPRESSION: 1. Mixed density subdural hematoma along the left anterior and lateral convexity measuring up to 15 mm maximum thickness with mass effect on the underlying left frontal lobe. About 2 mm shift to the right  anteriorly. 2. Trace right anterior convexity subdural hematoma measuring 4 mm. Suspected trace amount of subdural blood along the right anterior falx measuring 2 mm in maximum thickness. Critical Value/emergent results were called by telephone at the time of interpretation on 09/27/2023 at 9:04 pm to provider Erna Brossard , who verbally acknowledged these results. Electronically Signed   By: Luke Bun M.D.   On: 09/27/2023 21:04   DG Knee Complete 4 Views Left Result Date: 09/27/2023 CLINICAL DATA:  Knee pain after fall. EXAM: LEFT KNEE - COMPLETE 4+ VIEW COMPARISON:  None Available. FINDINGS: No evidence of fracture, dislocation, or joint effusion. No evidence of arthropathy or other focal bone abnormality. Peripheral vascular calcifications. IMPRESSION: 1. No fracture or  dislocation of the left knee. 2. Peripheral vascular disease. Electronically Signed   By: Andrea Gasman M.D.   On: 09/27/2023 18:20     .Critical Care  Performed by: Emil Share, DO Authorized by: Emil Share, DO   Critical care provider statement:    Critical care time (minutes):  35   Critical care time was exclusive of:  Separately billable procedures and treating other patients   Critical care was time spent personally by me on the following activities:  Development of treatment plan with patient or surrogate, discussions with consultants, evaluation of patient's response to treatment, examination of patient, ordering and review of laboratory studies, ordering and review of radiographic studies, ordering and performing treatments and interventions, pulse oximetry, re-evaluation of patient's condition and review of old charts   Care discussed with: admitting provider      Medications Ordered in the ED  sodium chloride  0.9 % bolus 1,000 mL (1,000 mLs Intravenous New Bag/Given 09/27/23 2015)                                    Medical Decision Making Amount and/or Complexity of Data Reviewed Labs: ordered. Radiology:  ordered.   63 yo M with a chief complaints of having too much to drink this morning.  He said he drank so much that he was unable to walk.  He was brought in by EMS.  There is some concern that maybe he did hurt his knee and so an x-ray was ordered.  Patient told me his knee is fine he is feeling better and thinks he is able to walk on his own and would like to go home.  Will attempt to ambulate.  Attempted to ambulate the patient but he is unable.  It does seem to be a functional problem with the left leg.  He has no obvious weakness with it no obvious ligamentous laxity no pain with range of motion of the knee.  Bony tries to stand he tells me that he feels like it is unstable and he is unable to lift it up to walk.  Will obtain a plain film.  The knee independently interpreted by me without fracture or dislocation.  Patient placed into a knee immobilizer given crutches and was able to ambulate independently to the bathroom and back without issue.  Had discussion with him at bedside he would like to try and go home at this time.  Plan to follow-up with his orthopedist in the office.  I was notified by nursing that the patient had fallen in the waiting room.  He did strike his head.  Will CT.  I had a discussion with the patient.  He denies injury in the fall.  He thinks he might benefit from IV fluids.  He feels like both legs are weak.  He has trouble describing this.  He thinks maybe is a problem of both his knees.  I encouraged him to obtain CT imaging head.  Will give bolus of IV fluids blood work reassess.  CT of the head is concerning for bilateral subdurals.  More thorough neuroexam obtained, has some decreased coordination with the left lower extremity otherwise strength appears intact extraocular motion intact.  Finger-to-nose without issue.   I discussed this with neurosurgery, Dr. Darnella he recommended repeat head CT in 6 hours as the patient cannot walk he recommends medical admission  at [facility] preferably.  The patients  results and plan were reviewed and discussed.   Any x-rays performed were independently reviewed by myself.   Differential diagnosis were considered with the presenting HPI.  Medications  sodium chloride  0.9 % bolus 1,000 mL (1,000 mLs Intravenous New Bag/Given 09/27/23 2015)    Vitals:   09/27/23 1632 09/27/23 1937  BP: 131/84 124/84  Pulse: 99 (!) 104  Resp: 20 19  Temp: 98 F (36.7 C) 98.2 F (36.8 C)  TempSrc: Oral Oral  SpO2: 99% 100%    Final diagnoses:  Acute pain of left knee  SDH (subdural hematoma) (HCC)    Admission/ observation were discussed with the admitting physician, patient and/or family and they are comfortable with the plan.       Final diagnoses:  Acute pain of left knee  SDH (subdural hematoma) [facility])    ED Discharge Orders     None          Emil Share, DO 09/27/23 2134

## 2023-09-27 NOTE — ED Notes (Signed)
 Pt refusing xray at this time

## 2023-09-27 NOTE — ED Notes (Signed)
 Pt unable to ambulate in hallway. Pt agreeable for xray

## 2023-09-27 NOTE — Assessment & Plan Note (Signed)
Restart Toprol when able to tolerate

## 2023-09-27 NOTE — Assessment & Plan Note (Signed)
Will rehydrate and follow renal function  °

## 2023-09-27 NOTE — Progress Notes (Signed)
 Orthopedic Tech Progress Note Patient Details:  CARLYLE MCELRATH [birth year] [id number]  Ortho Devices Type of Ortho Device: Crutches, Knee Immobilizer Ortho Device/Splint Location: left knee immobilizer applied. crutches sized and instructed on use Ortho Device/Splint Interventions: Ordered, Application, Adjustment   Post Interventions Patient Tolerated: Well, Difficulty with ambulation Instructions Provided: Adjustment of device, Care of device  Waylan Thom Loving 09/27/2023, 6:16 PM

## 2023-09-28 ENCOUNTER — Observation Stay (HOSPITAL_COMMUNITY): Payer: Self-pay

## 2023-09-28 DIAGNOSIS — S0990XA Unspecified injury of head, initial encounter: Secondary | ICD-10-CM

## 2023-09-28 DIAGNOSIS — R270 Ataxia, unspecified: Secondary | ICD-10-CM

## 2023-09-28 LAB — LIPID PANEL
Cholesterol: 213 mg/dL — ABNORMAL HIGH (ref 0–200)
HDL: 104 mg/dL (ref >40)
LDL Cholesterol: 95 mg/dL (ref 0–99)
Total CHOL/HDL Ratio: 2 ratio
Triglycerides: 68 mg/dL (ref <150)
VLDL: 14 mg/dL (ref 0–40)

## 2023-09-28 LAB — COMPREHENSIVE METABOLIC PANEL WITH GFR
ALT: 51 U/L — ABNORMAL HIGH (ref 0–44)
AST: 76 U/L — ABNORMAL HIGH (ref 15–41)
Albumin: 3.8 g/dL (ref 3.5–5.0)
Alkaline Phosphatase: 42 U/L (ref 38–126)
Anion gap: 14 (ref 5–15)
BUN: <5 mg/dL — ABNORMAL LOW (ref 8–23)
CO2: 18 mmol/L — ABNORMAL LOW (ref 22–32)
Calcium: 8.8 mg/dL — ABNORMAL LOW (ref 8.9–10.3)
Chloride: 102 mmol/L (ref 98–111)
Creatinine, Ser: 0.92 mg/dL (ref 0.61–1.24)
GFR, Estimated: >60 mL/min (ref >60)
Glucose, Bld: 96 mg/dL (ref 70–99)
Potassium: 3.5 mmol/L (ref 3.5–5.1)
Sodium: 134 mmol/L — ABNORMAL LOW (ref 135–145)
Total Bilirubin: 1.6 mg/dL — ABNORMAL HIGH (ref 0.0–1.2)
Total Protein: 6.7 g/dL (ref 6.5–8.1)

## 2023-09-28 LAB — CBC
HCT: 40.3 % (ref 39.0–52.0)
Hemoglobin: 14.3 g/dL (ref 13.0–17.0)
MCH: 34.3 pg — ABNORMAL HIGH (ref 26.0–34.0)
MCHC: 35.5 g/dL (ref 30.0–36.0)
MCV: 96.6 fL (ref 80.0–100.0)
Platelets: 187 K/uL (ref 150–400)
RBC: 4.17 MIL/uL — ABNORMAL LOW (ref 4.22–5.81)
RDW: 13 % (ref 11.5–15.5)
WBC: 10.7 K/uL — ABNORMAL HIGH (ref 4.0–10.5)
nRBC: 0 % (ref 0.0–0.2)

## 2023-09-28 LAB — FOLATE: Folate: 29.4 ng/mL (ref >5.9)

## 2023-09-28 LAB — FERRITIN: Ferritin: 209 ng/mL (ref 24–336)

## 2023-09-28 LAB — CK: Total CK: 2479 U/L — ABNORMAL HIGH (ref 49–397)

## 2023-09-28 LAB — MAGNESIUM: Magnesium: 1.6 mg/dL — ABNORMAL LOW (ref 1.7–2.4)

## 2023-09-28 LAB — HIV ANTIBODY (ROUTINE TESTING W REFLEX): HIV Screen 4th Generation wRfx: NONREACTIVE

## 2023-09-28 LAB — IRON AND TIBC
Iron: 85 ug/dL (ref 45–182)
Saturation Ratios: 27 % (ref 17.9–39.5)
TIBC: 315 ug/dL (ref 250–450)
UIBC: 230 ug/dL

## 2023-09-28 LAB — VITAMIN B12: Vitamin B-12: 352 pg/mL (ref 180–914)

## 2023-09-28 LAB — PHOSPHORUS: Phosphorus: 4.5 mg/dL (ref 2.5–4.6)

## 2023-09-28 LAB — VITAMIN D 25 HYDROXY (VIT D DEFICIENCY, FRACTURES): Vit D, 25-Hydroxy: 36.28 ng/mL (ref 30–100)

## 2023-09-28 LAB — PREALBUMIN: Prealbumin: 26 mg/dL (ref 18–38)

## 2023-09-28 MED ORDER — FENTANYL CITRATE PF 50 MCG/ML IJ SOSY: 12.5000 ug | PREFILLED_SYRINGE | INTRAMUSCULAR | Status: DC | PRN

## 2023-09-28 MED ORDER — CHLORDIAZEPOXIDE HCL 25 MG PO CAPS
25.0000 mg | ORAL_CAPSULE | Freq: Two times a day (BID) | ORAL | Status: AC
  Administered 2023-09-29 – 2023-09-30 (×2): 25 mg via ORAL
  Filled 2023-09-28 (×2): qty 1

## 2023-09-28 MED ORDER — HYDRALAZINE HCL 20 MG/ML IJ SOLN: 10.0000 mg | INTRAMUSCULAR | Status: DC | PRN

## 2023-09-28 MED ORDER — METOPROLOL TARTRATE 5 MG/5ML IV SOLN: 5.0000 mg | INTRAVENOUS | Status: DC | PRN

## 2023-09-28 MED ORDER — LORAZEPAM 1 MG PO TABS
2.0000 mg | ORAL_TABLET | ORAL | Status: AC | PRN
  Administered 2023-09-28 – 2023-09-30 (×4): 2 mg via ORAL
  Filled 2023-09-28 (×4): qty 2

## 2023-09-28 MED ORDER — METOPROLOL SUCCINATE ER 50 MG PO TB24
100.0000 mg | ORAL_TABLET | Freq: Every day | ORAL | Status: DC
  Administered 2023-09-28 – 2023-10-04 (×9): 100 mg via ORAL
  Filled 2023-09-28 (×7): qty 2

## 2023-09-28 MED ORDER — CHLORDIAZEPOXIDE HCL 25 MG PO CAPS
25.0000 mg | ORAL_CAPSULE | Freq: Every day | ORAL | Status: AC
  Administered 2023-09-30: 25 mg via ORAL
  Filled 2023-09-28: qty 1

## 2023-09-28 MED ORDER — ALBUTEROL SULFATE (2.5 MG/3ML) 0.083% IN NEBU: 2.5000 mg | INHALATION_SOLUTION | Freq: Four times a day (QID) | RESPIRATORY_TRACT | Status: DC | PRN

## 2023-09-28 MED ORDER — IPRATROPIUM-ALBUTEROL 0.5-2.5 (3) MG/3ML IN SOLN: 3.0000 mL | RESPIRATORY_TRACT | Status: DC | PRN

## 2023-09-28 MED ORDER — ONDANSETRON HCL 4 MG/2ML IJ SOLN
4.0000 mg | Freq: Four times a day (QID) | INTRAMUSCULAR | Status: DC | PRN
  Administered 2023-09-28: 4 mg via INTRAVENOUS
  Filled 2023-09-28: qty 2

## 2023-09-28 MED ORDER — ALBUTEROL SULFATE (2.5 MG/3ML) 0.083% IN NEBU
2.5000 mg | INHALATION_SOLUTION | Freq: Four times a day (QID) | RESPIRATORY_TRACT | Status: DC
  Administered 2023-09-28: 2.5 mg via RESPIRATORY_TRACT
  Filled 2023-09-28: qty 3

## 2023-09-28 MED ORDER — LORAZEPAM 2 MG/ML IJ SOLN
1.0000 mg | Freq: Once | INTRAMUSCULAR | Status: AC
  Administered 2023-09-28: 1 mg via INTRAVENOUS
  Filled 2023-09-28: qty 1

## 2023-09-28 MED ORDER — LORAZEPAM 1 MG PO TABS
1.0000 mg | ORAL_TABLET | ORAL | Status: AC | PRN
  Administered 2023-09-28: 1 mg via ORAL
  Filled 2023-09-28: qty 1

## 2023-09-28 MED ORDER — ADULT MULTIVITAMIN W/MINERALS CH
1.0000 | ORAL_TABLET | Freq: Every day | ORAL | Status: DC
  Administered 2023-09-28 – 2023-10-04 (×9): 1 via ORAL
  Filled 2023-09-28 (×7): qty 1

## 2023-09-28 MED ORDER — CHLORDIAZEPOXIDE HCL 25 MG PO CAPS
25.0000 mg | ORAL_CAPSULE | Freq: Three times a day (TID) | ORAL | Status: AC
  Administered 2023-09-28 – 2023-09-29 (×3): 25 mg via ORAL
  Filled 2023-09-28 (×3): qty 1

## 2023-09-28 MED ORDER — THIAMINE HCL 100 MG/ML IJ SOLN: 100.0000 mg | Freq: Every day | INTRAMUSCULAR | Status: DC

## 2023-09-28 MED ORDER — GLUCAGON HCL RDNA (DIAGNOSTIC) 1 MG IJ SOLR: 1.0000 mg | INTRAMUSCULAR | Status: DC | PRN

## 2023-09-28 MED ORDER — SODIUM CHLORIDE 0.9 % IV SOLN: INTRAVENOUS | Status: DC

## 2023-09-28 MED ORDER — ACETAMINOPHEN 650 MG RE SUPP: 650.0000 mg | Freq: Four times a day (QID) | RECTAL | Status: DC | PRN

## 2023-09-28 MED ORDER — HYDROCODONE-ACETAMINOPHEN 5-325 MG PO TABS
1.0000 | ORAL_TABLET | ORAL | Status: DC | PRN
  Administered 2023-09-28: 1 via ORAL
  Administered 2023-09-30: 2 via ORAL
  Administered 2023-09-30: 1 via ORAL
  Administered 2023-10-01 – 2023-10-03 (×4): 2 via ORAL
  Filled 2023-09-28: qty 2
  Filled 2023-09-28: qty 1
  Filled 2023-09-28 (×2): qty 2
  Filled 2023-09-28: qty 1
  Filled 2023-09-28: qty 2

## 2023-09-28 MED ORDER — THIAMINE HCL 100 MG/ML IJ SOLN
500.0000 mg | INTRAVENOUS | Status: AC
  Administered 2023-09-28 – 2023-09-30 (×3): 500 mg via INTRAVENOUS
  Filled 2023-09-28 (×3): qty 5

## 2023-09-28 MED ORDER — HYDROCODONE-ACETAMINOPHEN 5-325 MG PO TABS
1.0000 | ORAL_TABLET | ORAL | Status: DC | PRN
  Administered 2023-09-28: 1 via ORAL
  Filled 2023-09-28: qty 1
  Filled 2023-09-28: qty 2

## 2023-09-28 MED ORDER — ONDANSETRON HCL 4 MG PO TABS: 4.0000 mg | ORAL_TABLET | Freq: Four times a day (QID) | ORAL | Status: DC | PRN

## 2023-09-28 MED ORDER — ACETAMINOPHEN 325 MG PO TABS: 650.0000 mg | ORAL_TABLET | Freq: Four times a day (QID) | ORAL | Status: DC | PRN

## 2023-09-28 MED ORDER — FOLIC ACID 1 MG PO TABS
1.0000 mg | ORAL_TABLET | Freq: Every day | ORAL | Status: DC
  Administered 2023-09-28 – 2023-10-04 (×9): 1 mg via ORAL
  Filled 2023-09-28 (×7): qty 1

## 2023-09-28 MED ORDER — THIAMINE MONONITRATE 100 MG PO TABS
100.0000 mg | ORAL_TABLET | Freq: Every day | ORAL | Status: DC
  Administered 2023-09-28: 100 mg via ORAL
  Filled 2023-09-28: qty 1

## 2023-09-28 NOTE — Progress Notes (Signed)
 Pt off the unit to MRI

## 2023-09-28 NOTE — Progress Notes (Signed)
 PROGRESS NOTE    Eddie Harris  FMW:[id number] DOB: [birth year] DOA: 09/27/2023 PCP: Lazoff, Shawn P, DO    Brief Narrative:   63 year old with history of alcohol use, HTN presented to the hospital with inability to walk and mechanical fall at home causing left knee pain.  CT of the head showed bilateral subdural hematoma with 2 mm shift.  EDP discussed case with neurosurgery who recommended neurochecks and repeat CT head.  He reportedly drinks 3-4 times weekly.  Assessment & Plan:  Principal Problem:   Subdural hematoma (HCC) Active Problems:   Essential hypertension, benign   Alcohol use disorder   Fatty liver, alcoholic   Rhabdomyolysis   Ataxia   Mechanical fall causing subdural hematoma - Initial CT head showed subdural hematoma with 2 mm midline shift.  Repeat CT head suggestive of similar findings at this time.  Neurosurgery has been consulted, Dr. Darnella.  Will continue supportive care, avoid any anticoagulation or aspirin at this time. Repeat CT head in 2 weeks.   Left knee pain -Likely from fall.  X-ray are unremarkable  Rhabdomyolysis Metabolic acidosis -Admission CK 1006, will continue IV fluids and repeat as appropriate -Repeat lab work pending this morning.  Initial anion gap 17   Alcohol use/transaminitis - Monitor for alcohol withdrawal symptoms.  Alcohol withdrawal protocol. Librium  taper. Psych consulted.   Ataxia -Suspect alcohol related.  MRI brain shows stable bleed. B12, folate and TSH are normal.  Start High dose Thiamine  x 3 days  Essential hypertension -On Toprol -XL.  IV as needed.    DVT prophylaxis: SCDs Start: 09/28/23 0226    Code Status: Full Code Family Communication:   Cont hosp stay for multiple issues.    Subjective: No new complaints.  Very unsteady Spouse Lisa at bedside.    Examination:  General exam: Appears calm and comfortable  Respiratory system: Clear to auscultation. Respiratory effort normal. Cardiovascular system:  S1 & S2 heard, RRR. No JVD, murmurs, rubs, gallops or clicks. No pedal edema. Gastrointestinal system: Abdomen is nondistended, soft and nontender. No organomegaly or masses felt. Normal bowel sounds heard. Central nervous system: Alert and oriented. No focal neurological deficits. Extremities: Symmetric 5 x 5 power. Very ataxic Skin: No rashes, lesions or ulcers Psychiatry: Judgement and insight appear Poor                Diet Orders (From admission, onward)     Start     Ordered   09/28/23 0226  Diet Heart Room service appropriate? Yes; Fluid consistency: Thin  Diet effective now       Question Answer Comment  Room service appropriate? Yes   Fluid consistency: Thin      09/28/23 0225            Objective: Vitals:   09/28/23 0316 09/28/23 0745 09/28/23 0800 09/28/23 1125  BP: 108/79 (!) 128/96 132/86 (!) 127/92  Pulse:  (!) 101 97 90  Resp:  16  19  Temp:  99.1 F (37.3 C)  98.5 F (36.9 C)  TempSrc:  Oral  Oral  SpO2:  96%  95%  Weight:      Height:        Intake/Output Summary (Last 24 hours) at 09/28/2023 1300 Last data filed at 09/28/2023 0413 Gross per 24 hour  Intake 136.49 ml  Output --  Net 136.49 ml   Filed Weights   09/28/23 0235  Weight: 99 kg    Scheduled Meds:  chlordiazePOXIDE   25 mg Oral TID  Followed by   NOREEN ON 09/29/2023] chlordiazePOXIDE   25 mg Oral BID   Followed by   NOREEN ON 09/30/2023] chlordiazePOXIDE   25 mg Oral Q1500   folic acid   1 mg Oral Daily   metoprolol  succinate  100 mg Oral Daily   multivitamin with minerals  1 tablet Oral Daily   Continuous Infusions:  thiamine  (VITAMIN B1) injection 500 mg (09/28/23 1158)    Nutritional status     Body mass index is 32.23 kg/m.  Data Reviewed:   CBC: Recent Labs  Lab 09/27/23 2020 09/28/23 0620  WBC 12.1* 10.7*  NEUTROABS 8.1*  --   HGB 15.7 14.3  HCT 45.9 40.3  MCV 98.1 96.6  PLT 223 187   Basic Metabolic Panel: Recent Labs  Lab 09/27/23 2020  09/27/23 2223 09/28/23 0620  NA 135  --  134*  K 3.5  --  3.5  CL 99  --  102  CO2 19*  --  18*  GLUCOSE 102*  --  96  BUN 6*  --  <5*  CREATININE 0.98  --  0.92  CALCIUM  8.9  --  8.8*  MG  --  2.0 1.6*  PHOS  --  2.3* 4.5   GFR: Estimated Creatinine Clearance: 96.6 mL/min (by C-G formula based on SCr of 0.92 mg/dL). Liver Function Tests: Recent Labs  Lab 09/27/23 2020 09/28/23 0620  AST 71* 76*  ALT 57* 51*  ALKPHOS 55 42  BILITOT 0.9 1.6*  PROT 7.7 6.7  ALBUMIN 4.3 3.8   No results for input(s): LIPASE, AMYLASE in the last 168 hours. Recent Labs  Lab 09/27/23 2234  AMMONIA 18   Coagulation Profile: Recent Labs  Lab 09/27/23 2233  INR 0.9   Cardiac Enzymes: Recent Labs  Lab 09/27/23 2223 09/28/23 0620  CKTOTAL 1,006* 2,479*   BNP (last 3 results) No results for input(s): PROBNP in the last 8760 hours. HbA1C: No results for input(s): HGBA1C in the last 72 hours. CBG: No results for input(s): GLUCAP in the last 168 hours. Lipid Profile: Recent Labs    09/28/23 0620  CHOL 213*  HDL 104  LDLCALC 95  TRIG 68  CHOLHDL 2.0   Thyroid Function Tests: Recent Labs    09/27/23 2233  TSH 2.728   Anemia Panel: Recent Labs    09/27/23 2220 09/27/23 2233 09/28/23 0620  VITAMINB12  --   --  352  FOLATE  --   --  29.4  FERRITIN  --  209  --   TIBC  --  315  --   IRON  --  85  --   RETICCTPCT 1.9  --   --    Sepsis Labs: No results for input(s): PROCALCITON, LATICACIDVEN in the last 168 hours.  No results found for this or any previous visit (from the past 240 hours).       Radiology Studies: MR BRAIN WO CONTRAST Result Date: 09/28/2023 CLINICAL DATA:  Head trauma, ataxia EXAM: MRI HEAD WITHOUT CONTRAST TECHNIQUE: Multiplanar, multiecho pulse sequences of the brain and surrounding structures were obtained without intravenous contrast. COMPARISON:  CT September 28, 2023, May 25, 2022 FINDINGS: MRI brain: There is a subdural hematoma  over the anterior left frontal convexity. The maximum thickness is about 1.7 cm. There is displacement of the left frontal lobe but no midline shift. The collection is isointense on T1 with high signal on T2 and FLAIR. The collection was hypointense on T1. There is a very small similar abnormality  just to the right of midline. There is no significant signal abnormality in the brain parenchyma. There is no acute or chronic infarct. The ventricles are normal. No mass lesion. There are normal flow signals in the carotid arteries and basilar artery. No significant bone marrow signal abnormality. No significant abnormality in the paranasal sinuses or soft tissues. IMPRESSION: Subdural hematoma over the left anterior frontal convexity measures 1.7 cm in thickness. It is most likely subacute or chronic, but is new compared with the previous CT from May 25, 2022. Electronically Signed   By: Nancyann Burns M.D.   On: 09/28/2023 11:09   CT Head Wo Contrast Result Date: 09/28/2023 EXAM: CT HEAD WITHOUT CONTRAST 09/28/2023 02:02:32 AM TECHNIQUE: CT of the head was performed without the administration of intravenous contrast. Automated exposure control, iterative reconstruction, and/or weight based adjustment of the mA/kV was utilized to reduce the radiation dose to as low as reasonably achievable. COMPARISON: Comparison with prior studies dated 09/27/2018 and 12/17/2023. CLINICAL HISTORY: Subdural hematoma. Evaluate SDH seen on earlier CT head, patient going to [facility] CT. FINDINGS: BRAIN AND VENTRICLES: There is an unchanged epidural hematoma along the left anterior and lateral cerebral convexity, measuring 16 mm in radial dimension. Some of the hemorrhage crosses the midline into the anteromedial right frontal calvarium, measuring 4 mm. There is mild mass effect on the anterior left frontal lobe with no evidence of herniation. No evidence of acute infarct or new intracranial hemorrhage is seen. The ventricular caliber is  stable, with a 2 mm rightward midline shift at the septum pellucidum. ORBITS: No acute abnormality. SINUSES: No acute abnormality. SOFT TISSUES AND SKULL: No acute soft tissue abnormality. No skull fracture. IMPRESSION: 1. Unchanged epidural hematoma along the left anterior and lateral cerebral convexity, measuring 16 mm in radial dimension, with some hemorrhage crossing the midline into the anteromedial right frontal calvarium, measuring 4 mm. 2. Mild mass effect on the anterior left frontal lobe with 2 mm of rightward midline shift at the septum pellucidum. No evidence of herniation. 3. No new intracranial hemorrhage or acute infarct. Electronically signed by: Norman Gatlin MD 09/28/2023 02:14 AM EDT RP Workstation: [id number]   DG CHEST PORT 1 VIEW Result Date: 09/27/2023 CLINICAL DATA:  Fall EXAM: PORTABLE CHEST 1 VIEW COMPARISON:  05/25/2022 FINDINGS: The heart size and mediastinal contours are within normal limits. Both lungs are clear. The visualized skeletal structures are unremarkable. Hiatal hernia IMPRESSION: No active disease. Hiatal hernia. Electronically Signed   By: Luke Bun M.D.   On: 09/27/2023 22:47   CT Head Wo Contrast Result Date: 09/27/2023 CLINICAL DATA:  Head trauma abnormal mental status EXAM: CT HEAD WITHOUT CONTRAST TECHNIQUE: Contiguous axial images were obtained from the base of the skull through the vertex without intravenous contrast. RADIATION DOSE REDUCTION: This exam was performed according to the departmental dose-optimization program which includes automated exposure control, adjustment of the mA and/or kV according to patient size and/or use of iterative reconstruction technique. COMPARISON:  CT brain 05/25/2022 FINDINGS: Brain: No acute territorial infarction or intracranial mass. Mixed density subdural hematoma along the left anterior and lateral convexity, this measures 15 mm maximum thickness on sagittal series 6, image 35. Small amounts of slightly dense more acute  appearing blood within the subdural, series 2, image 23. Mass effect on the underlying left frontal lobe. There is trace right anterior convexity more isodense appearing subdural collection measuring 4 mm on series 2, image 20. Suspected trace amount of subdural blood along the right anterior  falx measuring 2 mm in maximum thickness on series 2, image 12. Overall stable ventricle size. About 2 mm shift to the right anteriorly. Vascular: No hyperdense vessels.  Carotid vascular calcification Skull: No fracture Sinuses/Orbits: No acute finding. Other: None Traumatic Brain Injury Risk Stratification Skull Fracture: No - Low/mBIG 1 Subdural Hematoma (SDH): 8mm plus - High/mBIG 3 Subarachnoid Hemorrhage [facility]): No Epidural Hematoma (EDH): No - Low/mBIG 1 Cerebral contusion, intra-axial, intraparenchymal Hemorrhage (IPH): No Intraventricular Hemorrhage (IVH): No - Low/mBIG 1 Midline Shift > 1mm or Edema/effacement of sulci/vents: Yes - High/mBIG 3 ---------------------------------------------------- IMPRESSION: 1. Mixed density subdural hematoma along the left anterior and lateral convexity measuring up to 15 mm maximum thickness with mass effect on the underlying left frontal lobe. About 2 mm shift to the right anteriorly. 2. Trace right anterior convexity subdural hematoma measuring 4 mm. Suspected trace amount of subdural blood along the right anterior falx measuring 2 mm in maximum thickness. Critical Value/emergent results were called by telephone at the time of interpretation on 09/27/2023 at 9:04 pm to provider DAN FLOYD , who verbally acknowledged these results. Electronically Signed   By: Luke Bun M.D.   On: 09/27/2023 21:04   DG Knee Complete 4 Views Left Result Date: 09/27/2023 CLINICAL DATA:  Knee pain after fall. EXAM: LEFT KNEE - COMPLETE 4+ VIEW COMPARISON:  None Available. FINDINGS: No evidence of fracture, dislocation, or joint effusion. No evidence of arthropathy or other focal bone abnormality.  Peripheral vascular calcifications. IMPRESSION: 1. No fracture or dislocation of the left knee. 2. Peripheral vascular disease. Electronically Signed   By: Andrea Gasman M.D.   On: 09/27/2023 18:20           LOS: 0 days   Time spent= 35 mins    [place]ess JAYSON Dare, MD [facility]  If 7PM-7AM, please contact night-coverage  09/28/2023, 1:00 PM

## 2023-09-28 NOTE — Progress Notes (Signed)
 Pt returned to unit from MRI.

## 2023-09-28 NOTE — Evaluation (Signed)
 Physical Therapy Evaluation Patient Details Name: Eddie Harris MRN: [id number] DOB: Jun 30, 1960 Today's Date: 09/28/2023  History of Present Illness  Pt is a 63 y.o. male presenting 8/5 after fall at home. Initial imaging negative, but fell again in ED and CTH with bil SDH along L anterior and lateral convexity measuring up to 15 mm thickness with mass effect on underlying left frontal lobe; trace right anterior convexity subdural hematoma measuring 4 mm. PMH: alcohol use, HTN, GERD, HLD, p-Afib.  Clinical Impression  Patient presents with decreased mobility due to generalized weakness, ataxic gait, decreased activity tolerance with HR to 132 with ambulation, decreased safety awareness and decreased balance.  Patient previously living alone in one level apartment with level entry.  Was driving and completing IADL's.  Currently mod A to +2 min A for balance and ambulation in hallway with high fall risk.  Feel he will benefit from skilled PT in the acute setting.  Hopeful with further in house PT with RW can improve to be able to d/c home with intermittent assist.  Reaching out to family as well to determine post-acute support.         If plan is discharge home, recommend the following: A little help with walking and/or transfers;Assist for transportation;Assistance with cooking/housework   Can travel by private vehicle        Equipment Recommendations Rolling walker (2 wheels)  Recommendations for Other Services       Functional Status Assessment Patient has had a recent decline in their functional status and demonstrates the ability to make significant improvements in function in a reasonable and predictable amount of time.     Precautions / Restrictions Precautions Precautions: Fall Precaution/Restrictions Comments: watch HR (CIWA)      Mobility  Bed Mobility Overal bed mobility: Modified Independent                  Transfers Overall transfer level: Needs assistance    Transfers: Sit to/from Stand Sit to Stand: Min assist, +2 safety/equipment           General transfer comment: assist for balance    Ambulation/Gait Ambulation/Gait assistance: Mod assist, Min assist, +2 safety/equipment Gait Distance (Feet): 120 Feet Assistive device: 2 person hand held assist Gait Pattern/deviations: Step-to pattern, Step-through pattern, Wide base of support, Shuffle       General Gait Details: broad based ataxic gait and pt relates feels like knees could buckle  Stairs            Wheelchair Mobility     Tilt Bed    Modified Rankin (Stroke Patients Only)       Balance Overall balance assessment: Needs assistance   Sitting balance-Leahy Scale: Good Sitting balance - Comments: able to doff and don socks seated EOB   Standing balance support: Bilateral upper extremity supported, Reliant on assistive device for balance Standing balance-Leahy Scale: Poor Standing balance comment: posterior bias initially standing, UE support needed for static balance                             Pertinent Vitals/Pain Pain Assessment Pain Assessment: Faces Faces Pain Scale: Hurts little more Pain Location: R knee Pain Descriptors / Indicators: Aching, Sore Pain Intervention(s): Monitored during session    Home Living Family/patient expects to be discharged to:: Private residence Living Arrangements: Alone Available Help at Discharge: Family;Available PRN/intermittently Type of Home: Apartment Home Access: Stairs to enter   Entrance  Stairs-Number of Steps: threshold   Home Layout: One level Home Equipment: Grab bars - tub/shower;Crutches      Prior Function Prior Level of Function : Independent/Modified Independent;Driving;Working/employed               ADLs Comments: self employed     Extremity/Trunk Assessment   Upper Extremity Assessment Upper Extremity Assessment: Defer to OT evaluation    Lower Extremity  Assessment Lower Extremity Assessment: RLE deficits/detail;LLE deficits/detail RLE Deficits / Details: limited AROM noted with donning sock in crossed leg position, reports needing knee replacement; strength grossly 4/5 some tremor noted LLE Deficits / Details: AROM WFL, strength at least 4/5 abrasion on knee from prior fall per pt       Communication        Cognition Arousal: Alert Behavior During Therapy: WFL for tasks assessed/performed, Anxious   PT - Cognitive impairments: No apparent impairments                         Following commands: Intact       Cueing Cueing Techniques: Verbal cues     General Comments General comments (skin integrity, edema, etc.): Educated in using incentive spirometer 3-5 times each hour awake; also in bridging for LE strengthening while in bed; HR max 132 with ambulation, BP Stable SpO2 93-96% on RA    Exercises     Assessment/Plan    PT Assessment Patient needs continued PT services  PT Problem List Decreased strength;Decreased balance;Decreased mobility;Decreased activity tolerance;Decreased safety awareness;Decreased knowledge of use of DME       PT Treatment Interventions DME instruction;Gait training;Patient/family education;Functional mobility training;Therapeutic activities;Therapeutic exercise;Balance training;Neuromuscular re-education    PT Goals (Current goals can be found in the Care Plan section)  Acute Rehab PT Goals Patient Stated Goal: return home PT Goal Formulation: With patient Time For Goal Achievement: 10/12/23 Potential to Achieve Goals: Good    Frequency Min 3X/week     Co-evaluation PT/OT/SLP Co-Evaluation/Treatment: Yes Reason for Co-Treatment: For patient/therapist safety;To address functional/ADL transfers PT goals addressed during session: Mobility/safety with mobility;Balance         AM-PAC PT 6 Clicks Mobility  Outcome Measure Help needed turning from your back to your side while in  a flat bed without using bedrails?: None Help needed moving from lying on your back to sitting on the side of a flat bed without using bedrails?: None Help needed moving to and from a bed to a chair (including a wheelchair)?: A Little Help needed standing up from a chair using your arms (e.g., wheelchair or bedside chair)?: A Little Help needed to walk in hospital room?: A Lot Help needed climbing 3-5 steps with a railing? : Total 6 Click Score: 17    End of Session Equipment Utilized During Treatment: Gait belt Activity Tolerance: Treatment limited secondary to medical complications (Comment) (HR to 130's with ambulation)     PT Visit Diagnosis: Other abnormalities of gait and mobility (R26.89);Ataxic gait (R26.0);History of falling (Z91.81)    Time: 1440-1510 PT Time Calculation (min) (ACUTE ONLY): 30 min   Charges:   PT Evaluation $PT Eval Moderate Complexity: 1 Mod   PT General Charges $$ ACUTE PT VISIT: 1 Visit         Micheline Portal, PT Acute Rehabilitation Services Office:[phone] 09/28/2023   [name] 09/28/2023, 3:26 PM

## 2023-09-28 NOTE — Plan of Care (Signed)

## 2023-09-28 NOTE — ED Notes (Signed)
[facility] contacted to transport patient
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
## 2023-09-28 NOTE — Hospital Course (Addendum)
 63 year old with history of alcohol use, HTN presented to the hospital with inability to walk and mechanical fall at home causing left knee pain.  CT of the head showed bilateral subdural hematoma with 2 mm shift.  EDP discussed case with neurosurgery who recommended neurochecks and repeat CT head.  He reportedly drinks 3-4 times weekly.  Repeat scan overall was stable therefore neurosurgery recommended outpatient CT scan in 2 weeks. Is getting IV fluids for rhabdomyolysis, on alcohol withdrawal protocol and high-dose thiamine  x 3 days Patient appears neurologically intact.  He has been managed with IV fluid hydration aggressively for rhabdomyolysis which has improved to 4000 at this time he is adamant on going home we will discharge him home instructed to follow-up with PCP neurosurgery for repeat CT head in 2 weeks from 8/6 He has been ambulating well.  Continue PT as outpatient  Subjective: Seen and examined today Overnight afebrile BP stable Patient has been very eager to go home for few days now His CK has been fluctuating  Discharge diagnosis  Mechanical fall with traumatic subdural hematoma: Repeat CT head stable per Dr. Darnella neurosurgery, repeat CT head in 2 weeks continue supportive care avoid anticoagulants or aspirin. Monitor sodium and electrolytes/.  Instructed to avoid any NSAID aspirin or blood thinner.  Again instructed that he needs repeat CT scan for nonrising provided to follow-up with neurosurgery.  Ataxia Left knee pain Deconditioning debility: In the setting of fall.  Suspect in the setting of alcohol use having ataxia.  X-ray knee unremarkable Brain MRI stable bleed, B12 folate TSH normal. S/p high-dose thiamine  completed 8/8 continue pain treatment, PT OT and mobilizing well  Rhabdomyolysis Metabolic acidosis: CK remained persistently elevated , being aggressively hydrated w/ IVF for UOP goal 200-250 cc/hr, renal function preserved. CK now downtrending.Plan of care  was discussed with nephrology, gi team previously,cont to trend ck and RFT-renal function was stable CK has improved to 4000 now stable for discharge> Patient is adamant on going home again  Recent Labs  Lab 09/29/23 0559 09/30/23 0602 09/30/23 1433 10/01/23 0552 10/01/23 1450 10/02/23 0703 10/02/23 1403 10/03/23 0640 10/03/23 1622 10/04/23 0630  CKTOTAL 10,561* >20,000* >20,000* >20,000* >20,000* >20,000* >20,000* 0,507* 15,206* 4,113*   Transaminitis Alcohol abuse At risk of withdrawal: S/P Librium  taper and high-dose thiamine .Monitor closely for signs of withdrawal.  Continue multivitamins.   LFTs elevated but slowly downtrending, monitor LFTs as outpatient as well and avoid alcohol.  He needs follow-up lab checked in 1 week from PCP for LFTs Recent Labs  Lab 09/27/23 2233 09/27/23 2234 09/28/23 0620 09/29/23 0559 09/30/23 0602 09/30/23 1433 10/01/23 0552 10/01/23 1450 10/02/23 0703 10/03/23 0640 10/04/23 0630  AST  --   --    < > 173* 808*   < > 647* 713* 677* 530* 351*  ALT  --   --    < > 51* 112*   < > 102* 114* 108* 105* 94*  ALKPHOS  --   --    < > 43 44   < > 37* 42 35* 36* 35*  BILITOT  --   --    < > 2.2* 1.8*   < > 0.9 0.9 0.9 0.8 0.8  PROT  --   --    < > 6.7 7.0   < > 5.8* 6.3* 5.6* 5.4* 5.5*  ALBUMIN  --   --    < > 3.6 3.6   < > 2.9* 3.3* 2.8* 2.8* 2.8*  AMMONIA  --  18  --   --   --   --   --   --   --   --   --   INR 0.9  --   --   --   --   --   --   --   --   --   --   PLT  --   --    < > 155 181  --  154  --  171 171  --    < > = values in this interval not displayed.   Essential hypertension Blood pressure is controlled continue amlodipine  5, Toprol  100  Class I Obesity w/ Body mass index is 32.23 kg/m.: Will benefit with PCP follow-up, weight loss,healthy lifestyle and outpatient sleep eval if not done.  Mobility: PT Orders: Active PT Follow up Rec: Outpatient Pt (Oppt Or If Unable Will Give Resources For [facility])10/03/2023  1358   DVT prophylaxis: SCDs Start: 09/28/23 0226 Code Status:   Code Status: Full Code Family Communication: plan of care discussed with patient at bedside. Patient status is: Remains hospitalized because of severity of illness Level of care: Telemetry Medical   Dispo: The patient is from: home            Anticipated disposition: Home today or tomorrow  Objective: Vitals last 24 hrs: Vitals:   10/03/23 2115 10/03/23 2317 10/04/23 0305 10/04/23 0848  BP: (!) 141/104 (!) 137/113 (!) 131/96 (!) 129/94  Pulse: 68 71 70 73  Resp:  (!) 22 20 18   Temp:  98.4 F (36.9 C) 98.1 F (36.7 C) 98 F (36.7 C)  TempSrc:  Oral Oral Oral  SpO2:  95% 95% 96%  Weight:      Height:        Physical Examination: General exam: AAOX3 HEENT:Oral mucosa moist, Ear/Nose WNL grossly Respiratory system: Bilaterally clear BS,no use of accessory muscle Cardiovascular system: S1 & S2 +, No JVD. Gastrointestinal system: Abdomen soft,NT,ND, BS+ Nervous System: Alert, awake, moving all extremities,and following commands. Extremities: LE edema neg, distal extremities warm.  Skin: No rashes,no icterus. MSK: Normal muscle bulk,tone, power   Medications reviewed:  Scheduled Meds:  amLODipine   5 mg Oral QHS   escitalopram   20 mg Oral QHS   folic acid   1 mg Oral Daily   LORazepam   1 mg Oral QHS   metoprolol  succinate  100 mg Oral Daily   multivitamin with minerals  1 tablet Oral Daily   thiamine   200 mg Oral Daily   Continuous Infusions:  sodium chloride  200 mL/hr at 10/04/23 0510   Diet: Diet Order             Diet regular Room service appropriate? Yes; Fluid consistency: Thin  Diet effective now

## 2023-09-28 NOTE — Evaluation (Signed)
 Occupational Therapy Evaluation Patient Details Name: Eddie Harris MRN: [id number] DOB: 1960-02-27 Today's Date: 09/28/2023   History of Present Illness   Pt is a 63 y.o. male presenting 8/5 after fall at home. Initial imaging negative, but fell again in ED and CTH with bil SDH along L anterior and lateral convexity measuring up to 15 mm thickness with mass effect on underlying left frontal lobe; trace right anterior convexity subdural hematoma measuring 4 mm. PMH: alcohol use, HTN, GERD, HLD, p-Afib.     Clinical Impressions PTA, pt lived alone and was independent. Upon eval, pt presents with decreased coordination, safety, balance, strength, and knowledge of use of DME/fall prevention strategies. Pt needing up to min A for LB ADL and set-up for UB ADL. Noted with BUE tremor and ataxic gait. Pt to continue to benefit from acute OT and HHOT at discharge.      If plan is discharge home, recommend the following:   A little help with walking and/or transfers;A little help with bathing/dressing/bathroom;Assistance with cooking/housework;Assist for transportation;Help with stairs or ramp for entrance     Functional Status Assessment   Patient has had a recent decline in their functional status and demonstrates the ability to make significant improvements in function in a reasonable and predictable amount of time.     Equipment Recommendations   BSC/3in1;Other (comment) (RW)     Recommendations for Other Services   Other (comment) (spiritual care)     Precautions/Restrictions   Precautions Precautions: Fall Precaution/Restrictions Comments: watch HR (CIWA) Restrictions Weight Bearing Restrictions Per Provider Order: No     Mobility Bed Mobility Overal bed mobility: Modified Independent                  Transfers Overall transfer level: Needs assistance   Transfers: Sit to/from Stand Sit to Stand: Min assist, +2 safety/equipment           General  transfer comment: assist for balance      Balance Overall balance assessment: Needs assistance   Sitting balance-Leahy Scale: Good Sitting balance - Comments: able to doff and don socks seated EOB   Standing balance support: Bilateral upper extremity supported, Reliant on assistive device for balance Standing balance-Leahy Scale: Poor Standing balance comment: posterior bias initially standing, UE support needed for static balance                           ADL either performed or assessed with clinical judgement   ADL Overall ADL's : Needs assistance/impaired Eating/Feeding: Modified independent;Bed level   Grooming: Set up;Sitting   Upper Body Bathing: Set up;Sitting   Lower Body Bathing: Minimal assistance;Sit to/from stand   Upper Body Dressing : Set up;Sitting   Lower Body Dressing: Contact guard assist;Sitting/lateral leans Lower Body Dressing Details (indicate cue type and reason): socks Toilet Transfer: Minimal assistance;+2 for safety/equipment;Ambulation           Functional mobility during ADLs: Minimal assistance;+2 for safety/equipment       Vision Baseline Vision/History: 1 Wears glasses Ability to See in Adequate Light: 0 Adequate Patient Visual Report: No change from baseline Vision Assessment?: No apparent visual deficits Additional Comments: reading and using phone [facility]     Perception         Praxis Praxis:  (no unequal coordination noted during dysdiadokinesia testing)       Pertinent Vitals/Pain Pain Assessment Pain Assessment: Faces Faces Pain Scale: Hurts little more Pain Location: R knee  Pain Descriptors / Indicators: Aching, Sore Pain Intervention(s): Limited activity within patient's tolerance     Extremity/Trunk Assessment Upper Extremity Assessment Upper Extremity Assessment: Overall WFL for tasks assessed (bil mild UE tremor; pt reports secondary to anxiety but unclear if he ever experiences this at baseline)    Lower Extremity Assessment Lower Extremity Assessment: Defer to PT evaluation RLE Deficits / Details: limited AROM noted with donning sock in crossed leg position, reports needing knee replacement; strength grossly 4/5 some tremor noted LLE Deficits / Details: AROM WFL, strength at least 4/5 abrasion on knee from prior fall per pt       Communication Communication Communication: No apparent difficulties   Cognition Arousal: Alert Behavior During Therapy: WFL for tasks assessed/performed, Anxious Cognition: No apparent impairments             OT - Cognition Comments: WFL for basic tasks assessed.                 Following commands: Intact       Cueing  General Comments   Cueing Techniques: Verbal cues  Educated in using incentive spirometer 3-5 times each hour awake; also in bridging for LE strengthening while in bed; HR max 132 with ambulation, BP Stable SpO2 93-96% on RA   Exercises     Shoulder Instructions      Home Living Family/patient expects to be discharged to:: Private residence Living Arrangements: Alone Available Help at Discharge: Family;Available PRN/intermittently Type of Home: Apartment Home Access: Stairs to enter Entrance Stairs-Number of Steps: threshold   Home Layout: One level     Bathroom Shower/Tub: Chief Strategy Officer: Handicapped height     Home Equipment: Grab bars - tub/shower;Crutches          Prior Functioning/Environment Prior Level of Function : Independent/Modified Independent;Driving;Working/employed               ADLs Comments: self employed    OT Problem List: Decreased strength;Decreased activity tolerance;Impaired balance (sitting and/or standing);Decreased coordination;Decreased safety awareness;Decreased knowledge of use of DME or AE;Impaired UE functional use   OT Treatment/Interventions: Self-care/ADL training;Therapeutic exercise;DME and/or AE instruction;Therapeutic  activities;Balance training;Patient/family education      OT Goals(Current goals can be found in the care plan section)   Acute Rehab OT Goals Patient Stated Goal: get better OT Goal Formulation: With patient Time For Goal Achievement: 10/12/23 Potential to Achieve Goals: Good   OT Frequency:  Min 2X/week    Co-evaluation PT/OT/SLP Co-Evaluation/Treatment: Yes Reason for Co-Treatment: For patient/therapist safety;To address functional/ADL transfers PT goals addressed during session: Mobility/safety with mobility;Balance OT goals addressed during session: ADL's and self-care      AM-PAC OT 6 Clicks Daily Activity     Outcome Measure Help from another person eating meals?: None Help from another person taking care of personal grooming?: A Little Help from another person toileting, which includes using toliet, bedpan, or urinal?: A Little Help from another person bathing (including washing, rinsing, drying)?: A Little Help from another person to put on and taking off regular upper body clothing?: A Little Help from another person to put on and taking off regular lower body clothing?: A Little 6 Click Score: 19   End of Session Equipment Utilized During Treatment: Gait belt Nurse Communication: Mobility status  Activity Tolerance: Patient tolerated treatment well Patient left: in bed;with call bell/phone within reach;with bed alarm set  OT Visit Diagnosis: Unsteadiness on feet (R26.81);History of falling (Z91.81);Muscle weakness (generalized) (M62.81)  Time: 8566-8497 OT Time Calculation (min): 29 min Charges:  OT General Charges $OT Visit: 1 Visit OT Evaluation $OT Eval Moderate Complexity: 1 Mod  Elma JONETTA Lebron FREDERICK, OTR/L [facility] Acute Rehabilitation Office: [phone]   Elma JONETTA Lebron 09/28/2023, 4:54 PM

## 2023-09-29 DIAGNOSIS — F1020 Alcohol dependence, uncomplicated: Secondary | ICD-10-CM

## 2023-09-29 DIAGNOSIS — F4322 Adjustment disorder with anxiety: Secondary | ICD-10-CM

## 2023-09-29 LAB — COMPREHENSIVE METABOLIC PANEL WITH GFR
ALT: 51 U/L — ABNORMAL HIGH (ref 0–44)
AST: 173 U/L — ABNORMAL HIGH (ref 15–41)
Albumin: 3.6 g/dL (ref 3.5–5.0)
Alkaline Phosphatase: 43 U/L (ref 38–126)
Anion gap: 14 (ref 5–15)
BUN: 5 mg/dL — ABNORMAL LOW (ref 8–23)
CO2: 22 mmol/L (ref 22–32)
Calcium: 9.1 mg/dL (ref 8.9–10.3)
Chloride: 100 mmol/L (ref 98–111)
Creatinine, Ser: 0.95 mg/dL (ref 0.61–1.24)
GFR, Estimated: >60 mL/min (ref >60)
Glucose, Bld: 98 mg/dL (ref 70–99)
Potassium: 3.9 mmol/L (ref 3.5–5.1)
Sodium: 136 mmol/L (ref 135–145)
Total Bilirubin: 2.2 mg/dL — ABNORMAL HIGH (ref 0.0–1.2)
Total Protein: 6.7 g/dL (ref 6.5–8.1)

## 2023-09-29 LAB — MAGNESIUM: Magnesium: 1.9 mg/dL (ref 1.7–2.4)

## 2023-09-29 LAB — CBC
HCT: 41.5 % (ref 39.0–52.0)
Hemoglobin: 14.6 g/dL (ref 13.0–17.0)
MCH: 34.4 pg — ABNORMAL HIGH (ref 26.0–34.0)
MCHC: 35.2 g/dL (ref 30.0–36.0)
MCV: 97.9 fL (ref 80.0–100.0)
Platelets: 155 K/uL (ref 150–400)
RBC: 4.24 MIL/uL (ref 4.22–5.81)
RDW: 12.9 % (ref 11.5–15.5)
WBC: 8.2 K/uL (ref 4.0–10.5)
nRBC: 0 % (ref 0.0–0.2)

## 2023-09-29 LAB — CK: Total CK: 10561 U/L — ABNORMAL HIGH (ref 49–397)

## 2023-09-29 MED ORDER — ESCITALOPRAM OXALATE 10 MG PO TABS
20.0000 mg | ORAL_TABLET | Freq: Every day | ORAL | Status: DC
  Administered 2023-09-29 – 2023-10-03 (×6): 20 mg via ORAL
  Filled 2023-09-29 (×5): qty 2

## 2023-09-29 NOTE — Consult Note (Addendum)
 [facility] Psychiatric Consult Initial  Patient Name: [name]  MRN: [id number]  DOB: 05/07/1960  Consult Order details:  Orders (From admission, onward)     Start     Ordered   09/28/23 1258  IP CONSULT TO PSYCHIATRY       Ordering Provider: Caleen Burgess BROCKS, MD  Provider:  (Not yet assigned)  Question Answer Comment  Location [facility]   Reason for Consult? EtoH Abuse. Rehab      09/28/23 1258             Mode of Visit: In person    Psychiatry Consult Evaluation  Service Date: September 29, 2023 LOS:  LOS: 1 day  Consulted for: [facility] Abuse. Rehab Primary Psychiatric Diagnoses  Alcohol use disorder, moderate 2.  Adjustment disorder with anxious mood  Assessment  Eddie Harris is a 63 y.o. male admitted: Presented to the ED on 09/27/2023  4:26 PM for ground level fall. He carries the psychiatric diagnoses of alcohol use disorder and has a past medical history of  HTN.   Alcohol use is affecting his lifestyle sufficiently to meet diagnostic criteria for Alcohol use disorder. His endorsement of varying anxiety in direct relation to his ongoing divorce is suggestive of an adjustment disorder. He has been on Lexapro  to assist with his mood, and is an a pre-contemplative stage for alcohol cessation.  On assessment, pt has gaps in his memory in events leading up to his current hospitalized state, not knowing about falling and hitting his head. Reports that mood has been up and down with complications from ongoing divorce which has been occurring for over a year from his wife of 36 years.  Anxiety reported to be 7/10 on difficult days, and depression 3/10 on difficult days.  Depression is overall denied, with intact interest, energy, concentration, and appetite. Reports mood as fine, and would be interested in seeking therapy for further help. He has had therapy in the past for one year with India Ingle which he found helpful, but did not disclose circumstances  requiring initiation of therapy. He has been on Lexapro  for a few years which has been prescribed by his PCP and he intends to keep taking it. He has never been seen by psychiatry, no psychiatric admissions, substance use rehabilitation or support groups in the past. Psychotic symptoms: denies A&VH.  Substance use: reports drinking 3-4 12 oz beers on the weekends, and not drinking on weekdays. Does not endorse any significant withdrawals from alcohol. Denies Marijuana use in years, and denies all other drug use. When asked about the [facility] level of 221 on admission, says he was drinking with his friends before he slipped outside and fell on the wet grass. When asked about the positive UDS for marijuana, he says that he maybe was smoking marijuana as well.  Denies drinking negatively affecting his work life or leading to any injuries, and says that it may be one of the things contributing to the ongoing divorce. Denies drinking first thing in the morning or people annoying him about his drinking. Says is maybe interested in cutting down to help him lose some weight.   Clarified that he does NOT want us  to speak to others about his care. He declines residential or outpatient substance use treatment.  Diagnoses:  Active Hospital problems: Principal Problem:   Subdural hematoma (HCC) Active Problems:   Essential hypertension, benign   Alcohol use disorder   Fatty liver, alcoholic   Rhabdomyolysis  Ataxia    Plan   ## Psychiatric Medication Recommendations:  -- Restart home Lexapro  20mg  every day -- continue Librium  taper and CIWA/Ativan  protocol as currently ordered by primary team   ## Medical Decision Making Capacity: Not specifically addressed in this encounter  ## Further Work-up:  -- substance use resources included in AVS for patient to utilize if they desire. -- most recent EKG on 8/6 had QtC of 483 -- Pertinent labwork reviewed earlier this admission includes: EtoH 221, UDS +  Marijuana, climbing CK of over 10k  ## Disposition:-- There are no psychiatric contraindications to discharge at this time  ## Behavioral / Environmental: - No specific recommendations at this time.     ## Safety and Observation Level:  - Based on my clinical evaluation, I estimate the patient to be at low risk of self harm in the current setting. - At this time, we recommend  routine monitoring. This decision is based on my review of the chart including patient's history and current presentation, interview of the patient, mental status examination, and consideration of suicide risk including evaluating suicidal ideation, plan, intent, suicidal or self-harm behaviors, risk factors, and protective factors. This judgment is based on our ability to directly address suicide risk, implement suicide prevention strategies, and develop a safety plan while the patient is in the clinical setting. Please contact our team if there is a concern that risk level has changed.  CSSR Risk Category:C-SSRS RISK CATEGORY: No Risk  Suicide Risk Assessment: Patient has following modifiable risk factors for suicide: access to guns, which are locked. Patient has following non-modifiable or demographic risk factors for suicide: male gender and separation or divorce Patient has the following protective factors against suicide:  Access to outpatient mental health care and Supportive friends. Pt also has had no thoughts of self harm or suicide.  Thank you for this consult request. Recommendations have been communicated to the primary team.  We will sign off at this time.  While future psychiatric events cannot be accurately predicted, the patient does not currently require acute inpatient psychiatric care and does not currently meet [facility]  involuntary commitment criteria.   Penne Mori, DO       History of Present Illness  Relevant Aspects of [facility] Course:  Admitted on 09/27/2023 for subdural  hematoma, has been evaluated and cleared by neurosurgery after negative imaging w/ recommended 2 week follow up. CK has been increasing from 1k to 10K on morning of 09/29/2023  Patient Report:  Minimizes alcohol use and marijuana use. He specifically denied SI, HI, AVH.  He endorses history of depression that has been controlled with Lexapro . He declines treatment for substance use rehabilitation.  He is open to outpatient therapy for stressors related to his chronic divorce.  He is able to identify his own therapist, as he has been in therapy in the past.  Psych ROS:  Depression: Denies Anxiety:  Endorses some related to his divorce Psychosis: (lifetime and current): Denies Mania:  symptoms not assessed Trauma:  not fully assessed, but none disclosed  Collateral information:  Pt does not want us  to reach out for collateral  Review of Systems  Respiratory: Negative.    Cardiovascular: Negative.   Gastrointestinal: Negative.   Psychiatric/Behavioral:  Negative for depression, hallucinations, substance abuse and suicidal ideas. The patient has insomnia.      Psychiatric and Social History  Psychiatric History:  Was seeing a therapist for 1 year about 10-15 years ago, would not disclose why.  Has been on Lexapro  20mg  by his PCP for a few years No psychiatric hospitalizations, has not seen a psychiatrist.  Social History:  Works in Psychologist, clinical Has firearm access  Substance History See HPI for more detailed history  Exam Findings   Vital Signs:  Temp:  [98.1 F (36.7 C)-98.9 F (37.2 C)] 98.1 F (36.7 C) (08/07 0316) Pulse Rate:  [78-99] 82 (08/07 0743) Resp:  [15-30] 15 (08/07 0316) BP: (124-153)/(82-99) 126/95 (08/07 0743) SpO2:  [93 %-98 %] 98 % (08/07 0316) Blood pressure (!) 126/95, pulse 82, temperature 98.1 F (36.7 C), temperature source Oral, resp. rate 15, height 5' 9 (1.753 m), weight 99 kg, SpO2 98%. Body mass index is 32.23 kg/m.  Physical  Exam Vitals reviewed.  Constitutional:      General: He is not in acute distress.    Appearance: He is not toxic-appearing.  Pulmonary:     Effort: Pulmonary effort is normal. No respiratory distress.  Neurological:     Mental Status: He is alert.     Mental Status Exam  Apperance: Appropriate for environment, Casual, and Laying in bed Behavior: Calm Speech: Normal Rate, Articulate, Normal Volume, and Responsive Attitude: Cooperative and Friendly Mood: fine Affect: Euthymic, Normal Range, and Mood Congruent Perception: Not responding to internal stimuli Thought Content: within normal limits Thought Form: Goal Directed, Organized, Linear, and Logical Cognition: Alert & Oriented to person, place, and time, Recent and Remote memory grossly intact by recounting personal history, and Immediate memory grossly intact by interview Judgment: POOR Insight: POOR   Key Points: Denies SI, HI, A&VH       Other History   These have been pulled in through the EMR, reviewed, and updated if appropriate.  Family History:  The patient's family history includes Asthma in his son. He was adopted.  Medical History: Past Medical History:  Diagnosis Date   Anxiety    Atrial fibrillation (HCC) 02/22/1998   negative cardiac workup (Dr. Elpidio).  Episode occurred after heavy drinking   Elevated blood pressure reading without diagnosis of hypertension 02/24/2011   GERD (gastroesophageal reflux disease)    silent--EGD Dr. Luis; pt denies reflux, saw duodenal irritation on EGD   Hyperlipidemia    Mild obesity    Ocular migraine    Dr. Alm Pinal   Paroxysmal atrial fibrillation (HCC)    PVC (premature ventricular contraction)    Pyloric stenosis    as an infant; vs hernia repair--both documented in his chart   Skin cancer, basal cell    Dr. Lemond Ill    Surgical History: Past Surgical History:  Procedure Laterality Date   basal cell skin cancer excision     HERNIA REPAIR  age  37 weeks   ?diaphragmatic given age     Medications:   Current Facility-Administered Medications:    acetaminophen  (TYLENOL ) tablet 650 mg, 650 mg, Oral, Q6H PRN **OR** acetaminophen  (TYLENOL ) suppository 650 mg, 650 mg, Rectal, Q6H PRN, Doutova, Anastassia, MD   chlordiazePOXIDE  (LIBRIUM ) capsule 25 mg, 25 mg, Oral, TID, 25 mg at 09/28/23 2113 **FOLLOWED BY** chlordiazePOXIDE  (LIBRIUM ) capsule 25 mg, 25 mg, Oral, BID **FOLLOWED BY** [START ON 09/30/2023] chlordiazePOXIDE  (LIBRIUM ) capsule 25 mg, 25 mg, Oral, Q1500, Amin, Ankit C, MD   folic acid  (FOLVITE ) tablet 1 mg, 1 mg, Oral, Daily, Kakrakandy, Arshad N, MD, 1 mg at 09/28/23 9093   glucagon  (human recombinant) (GLUCAGEN) injection 1 mg, 1 mg, Intravenous, PRN, Amin, Ankit C, MD   hydrALAZINE  (APRESOLINE ) injection 10 mg,  10 mg, Intravenous, Q4H PRN, Amin, Ankit C, MD   HYDROcodone -acetaminophen  (NORCO/VICODIN) 5-325 MG per tablet 1-2 tablet, 1-2 tablet, Oral, Q4H PRN, Amin, Ankit C, MD, 1 tablet at 09/28/23 1543   ipratropium-albuterol  (DUONEB) 0.5-2.5 (3) MG/3ML nebulizer solution 3 mL, 3 mL, Nebulization, Q4H PRN, Amin, Ankit C, MD   LORazepam  (ATIVAN ) tablet 1-4 mg, 1-4 mg, Oral, Q1H PRN, 1 mg at 09/28/23 1542 **OR** LORazepam  (ATIVAN ) tablet 2 mg, 2 mg, Oral, Q4H PRN, Amin, Ankit C, MD, 2 mg at 09/28/23 2113   metoprolol  succinate (TOPROL -XL) 24 hr tablet 100 mg, 100 mg, Oral, Daily, Doutova, Anastassia, MD, 100 mg at 09/28/23 0906   metoprolol  tartrate (LOPRESSOR ) injection 5 mg, 5 mg, Intravenous, Q4H PRN, Amin, Ankit C, MD   multivitamin with minerals tablet 1 tablet, 1 tablet, Oral, Daily, Franky Redia SAILOR, MD, 1 tablet at 09/28/23 9093   ondansetron  (ZOFRAN ) tablet 4 mg, 4 mg, Oral, Q6H PRN **OR** ondansetron  (ZOFRAN ) injection 4 mg, 4 mg, Intravenous, Q6H PRN, Doutova, Anastassia, MD, 4 mg at 09/28/23 0252   thiamine  (VITAMIN B1) 500 mg in sodium chloride  0.9 % 50 mL IVPB, 500 mg, Intravenous, Q24H, Amin, Ankit C, MD, Stopped at  09/28/23 1229  Allergies: Allergies  Allergen Reactions   Shellfish-Derived Products Anaphylaxis and Nausea And Vomiting    Penne Mori, DO  I have reviewed the note by Dr. Mori, and discussed the case.  I am in agreement with the assessment and plan.  I have independently interviewed and assessed the patient, and addended note with my findings.   DOROTHE CHRISTELLA GRIFFINS, MD  Total Time Spent in Direct Patient Care:  I personally spent 65 minutes on the unit in direct patient care. The direct patient care time included face-to-face time with the patient, reviewing the patient's chart, communicating with other professionals, and coordinating care. Greater than 50% of this time was spent in counseling or coordinating care with the patient regarding goals of hospitalization, psycho-education, and discharge planning needs.
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
## 2023-09-29 NOTE — Progress Notes (Signed)
 Physical Therapy Treatment Patient Details Name: Eddie Harris MRN: [id number] DOB: 07-Feb-1961 Today's Date: 09/29/2023   History of Present Illness Pt is a 63 y.o. male presenting 8/5 after fall at home. Initial imaging negative, but fell again in ED and CTH with bil SDH along L anterior and lateral convexity measuring up to 15 mm thickness with mass effect on underlying left frontal lobe; trace right anterior convexity subdural hematoma measuring 4 mm. PMH: alcohol use, HTN, GERD, HLD, p-Afib.    PT Comments  Patient progressing well with mobility using RW for safety and balance.  Today noted no tremor and HR maintained 80's to low 100's.  Patient able to complete toileting task with supervision.  Also knee ligamentous testing without signs of laxity so working on knee strength.  PT will continue to follow.     If plan is discharge home, recommend the following: A little help with walking and/or transfers;Assist for transportation;Assistance with cooking/housework   Can travel by private vehicle        Equipment Recommendations  Rolling walker (2 wheels)    Recommendations for Other Services       Precautions / Restrictions Precautions Precautions: Fall Precaution/Restrictions Comments: watch HR (CIWA)     Mobility  Bed Mobility Overal bed mobility: Modified Independent             General bed mobility comments: already sitting EOB upon PT entry    Transfers Overall transfer level: Needs assistance Equipment used: None Transfers: Sit to/from Stand Sit to Stand: Supervision, Contact guard assist           General transfer comment: stood from EOB unaided despite PT encouragement to wait for assistance; stood with CGA for using RW for safety and cues    Ambulation/Gait Ambulation/Gait assistance: Supervision, Contact guard assist Gait Distance (Feet): 400 Feet Assistive device: Rolling walker (2 wheels) Gait Pattern/deviations: Step-through pattern, Decreased  stride length       General Gait Details: with RW and CGA progressing to S in hallway reports L knee feels like could give away medially   Stairs             Wheelchair Mobility     Tilt Bed    Modified Rankin (Stroke Patients Only)       Balance Overall balance assessment: Needs assistance   Sitting balance-Leahy Scale: Good     Standing balance support: No upper extremity supported Standing balance-Leahy Scale: Fair Standing balance comment: stood in bathroom for toileting with distant S and intermittent UE support, washed hands at sink with S                            Communication Communication Communication: No apparent difficulties  Cognition Arousal: Alert Behavior During Therapy: WFL for tasks assessed/performed, Flat affect   PT - Cognitive impairments: No apparent impairments                         Following commands: Intact      Cueing Cueing Techniques: Verbal cues  Exercises General Exercises - Lower Extremity Long Arc Quad: AROM, 10 reps, Both, Seated Other Exercises Other Exercises: sit<>stand x 5 minimal UE support    General Comments General comments (skin integrity, edema, etc.): HR 103, SpO2 94%; drawing up to on incentive spirometer; testing knee ligaments seated for varus/valgus, anterior/posterior drawer without pain or evidence of laxity.  Pertinent Vitals/Pain Pain Assessment Pain Assessment: Faces Faces Pain Scale: Hurts little more Pain Location: R knee Pain Descriptors / Indicators: Aching, Sore Pain Intervention(s): Monitored during session, Repositioned    Home Living                          Prior Function            PT Goals (current goals can now be found in the care plan section) Progress towards PT goals: Progressing toward goals    Frequency    Min 3X/week      PT Plan      Co-evaluation              AM-PAC PT 6 Clicks Mobility   Outcome  Measure  Help needed turning from your back to your side while in a flat bed without using bedrails?: None Help needed moving from lying on your back to sitting on the side of a flat bed without using bedrails?: None Help needed moving to and from a bed to a chair (including a wheelchair)?: A Little Help needed standing up from a chair using your arms (e.g., wheelchair or bedside chair)?: A Little Help needed to walk in hospital room?: A Little Help needed climbing 3-5 steps with a railing? : A Little 6 Click Score: 20    End of Session Equipment Utilized During Treatment: Gait belt Activity Tolerance: Patient tolerated treatment well Patient left: in chair;with chair alarm set;with call bell/phone within reach   PT Visit Diagnosis: Other abnormalities of gait and mobility (R26.89);Ataxic gait (R26.0);History of falling (Z91.81)     Time: 1349-1416 PT Time Calculation (min) (ACUTE ONLY): 27 min  Charges:    $Gait Training: 8-22 mins $Therapeutic Activity: 8-22 mins PT General Charges $$ ACUTE PT VISIT: 1 Visit                     Micheline Portal, PT Acute Rehabilitation Services [phone] 09/29/2023    [name] 09/29/2023, 5:21 PM

## 2023-09-29 NOTE — Progress Notes (Signed)
 PROGRESS NOTE    Eddie Harris  FMW:[id number] DOB: Apr 16, 1960 DOA: 09/27/2023 PCP: Lazoff, Shawn P, DO    Brief Narrative:   63 year old with history of alcohol use, HTN presented to the hospital with inability to walk and mechanical fall at home causing left knee pain.  CT of the head showed bilateral subdural hematoma with 2 mm shift.  EDP discussed case with neurosurgery who recommended neurochecks and repeat CT head.  He reportedly drinks 3-4 times weekly.  Repeat scan overall was stable therefore neurosurgery recommended outpatient CT scan in 2 weeks. Is getting IV fluids for rhabdomyolysis, on alcohol withdrawal protocol and high-dose thiamine  for total of 3 days, EOT 8/8.  Assessment & Plan:  Principal Problem:   Subdural hematoma (HCC) Active Problems:   Essential hypertension, benign   Alcohol use disorder   Fatty liver, alcoholic   Rhabdomyolysis   Ataxia   Mechanical fall causing subdural hematoma - Initial CT head showed subdural hematoma with 2 mm midline shift.  Repeat CT head suggestive of similar findings at this time.  Neurosurgery has been consulted, Dr. Darnella.  Will continue supportive care, avoid any anticoagulation or aspirin at this time. Repeat CT head in 2 weeks.   Left knee pain -Likely from fall.  X-ray are unremarkable  Rhabdomyolysis Metabolic acidosis -Admission CK 1006, will continue IV fluids and repeat as appropriate -Repeat lab work pending this morning.  Initial anion gap 17   Alcohol use/transaminitis - Monitor for alcohol withdrawal symptoms.  Alcohol withdrawal protocol. Librium  taper.  Psych recommended outpatient referral.  Ataxia -Suspect alcohol related.  MRI brain shows stable bleed. B12, folate and TSH are normal.  Started high-dose thiamine  X 3 days, EOT 8/8  Essential hypertension -On Toprol -XL.  IV as needed.  PT/OT-home health, after have completed  DVT prophylaxis: SCDs Start: 09/28/23 0226   Code Status: Full  Code Family Communication: None at bedside today Cont hosp stay for multiple issues. Hopefully dc tomorrow after IV thiamine .    Subjective:  No complaints Doing ok.    Examination:  General exam: Appears calm and comfortable  Respiratory system: Clear to auscultation. Respiratory effort normal. Cardiovascular system: S1 & S2 heard, RRR. No JVD, murmurs, rubs, gallops or clicks. No pedal edema. Gastrointestinal system: Abdomen is nondistended, soft and nontender. No organomegaly or masses felt. Normal bowel sounds heard. Central nervous system: Alert and oriented. No focal neurological deficits. Extremities: Symmetric 5 x 5 power. Very ataxic Skin: No rashes, lesions or ulcers Psychiatry: Judgement and insight appear Poor                Diet Orders (From admission, onward)     Start     Ordered   09/28/23 0226  Diet Heart Room service appropriate? Yes; Fluid consistency: Thin  Diet effective now       Question Answer Comment  Room service appropriate? Yes   Fluid consistency: Thin      09/28/23 0225            Objective: Vitals:   09/29/23 0222 09/29/23 0316 09/29/23 0743 09/29/23 0750  BP:  (!) 135/99 (!) 126/95 (!) 126/95  Pulse:  78 82 85  Resp: (!) 24 15  19   Temp:  98.1 F (36.7 C)  98.2 F (36.8 C)  TempSrc:  Oral  Oral  SpO2:  98%  94%  Weight:      Height:        Intake/Output Summary (Last 24 hours) at 09/29/2023 1048  Last data filed at 09/29/2023 0650 Gross per 24 hour  Intake 895 ml  Output 850 ml  Net 45 ml   Filed Weights   09/28/23 0235  Weight: 99 kg    Scheduled Meds:  chlordiazePOXIDE   25 mg Oral BID   Followed by   [START ON 09/30/2023] chlordiazePOXIDE   25 mg Oral Q1500   folic acid   1 mg Oral Daily   metoprolol  succinate  100 mg Oral Daily   multivitamin with minerals  1 tablet Oral Daily   Continuous Infusions:  thiamine  (VITAMIN B1) injection 500 mg (09/29/23 0841)    Nutritional status     Body mass index is  32.23 kg/m.  Data Reviewed:   CBC: Recent Labs  Lab 09/27/23 2020 09/28/23 0620 09/29/23 0559  WBC 12.1* 10.7* 8.2  NEUTROABS 8.1*  --   --   HGB 15.7 14.3 14.6  HCT 45.9 40.3 41.5  MCV 98.1 96.6 97.9  PLT 223 187 155   Basic Metabolic Panel: Recent Labs  Lab 09/27/23 2020 09/27/23 2223 09/28/23 0620 09/29/23 0559  NA 135  --  134* 136  K 3.5  --  3.5 3.9  CL 99  --  102 100  CO2 19*  --  18* 22  GLUCOSE 102*  --  96 98  BUN 6*  --  <5* 5*  CREATININE 0.98  --  0.92 0.95  CALCIUM  8.9  --  8.8* 9.1  MG  --  2.0 1.6* 1.9  PHOS  --  2.3* 4.5  --    GFR: Estimated Creatinine Clearance: 93.5 mL/min (by C-G formula based on SCr of 0.95 mg/dL). Liver Function Tests: Recent Labs  Lab 09/27/23 2020 09/28/23 0620 09/29/23 0559  AST 71* 76* 173*  ALT 57* 51* 51*  ALKPHOS 55 42 43  BILITOT 0.9 1.6* 2.2*  PROT 7.7 6.7 6.7  ALBUMIN 4.3 3.8 3.6   No results for input(s): LIPASE, AMYLASE in the last 168 hours. Recent Labs  Lab 09/27/23 2234  AMMONIA 18   Coagulation Profile: Recent Labs  Lab 09/27/23 2233  INR 0.9   Cardiac Enzymes: Recent Labs  Lab 09/27/23 2223 09/28/23 0620 09/29/23 0559  CKTOTAL 1,006* 2,479* 10,561*   BNP (last 3 results) No results for input(s): PROBNP in the last 8760 hours. HbA1C: No results for input(s): HGBA1C in the last 72 hours. CBG: No results for input(s): GLUCAP in the last 168 hours. Lipid Profile: Recent Labs    09/28/23 0620  CHOL 213*  HDL 104  LDLCALC 95  TRIG 68  CHOLHDL 2.0   Thyroid Function Tests: Recent Labs    09/27/23 2233  TSH 2.728   Anemia Panel: Recent Labs    09/27/23 2220 09/27/23 2233 09/28/23 0620  VITAMINB12  --   --  352  FOLATE  --   --  29.4  FERRITIN  --  209  --   TIBC  --  315  --   IRON  --  85  --   RETICCTPCT 1.9  --   --    Sepsis Labs: No results for input(s): PROCALCITON, LATICACIDVEN in the last 168 hours.  No results found for this or any  previous visit (from the past 240 hours).       Radiology Studies: MR BRAIN WO CONTRAST Result Date: 09/28/2023 CLINICAL DATA:  Head trauma, ataxia EXAM: MRI HEAD WITHOUT CONTRAST TECHNIQUE: Multiplanar, multiecho pulse sequences of the brain and surrounding structures were obtained without intravenous contrast.  COMPARISON:  CT September 28, 2023, May 25, 2022 FINDINGS: MRI brain: There is a subdural hematoma over the anterior left frontal convexity. The maximum thickness is about 1.7 cm. There is displacement of the left frontal lobe but no midline shift. The collection is isointense on T1 with high signal on T2 and FLAIR. The collection was hypointense on T1. There is a very small similar abnormality just to the right of midline. There is no significant signal abnormality in the brain parenchyma. There is no acute or chronic infarct. The ventricles are normal. No mass lesion. There are normal flow signals in the carotid arteries and basilar artery. No significant bone marrow signal abnormality. No significant abnormality in the paranasal sinuses or soft tissues. IMPRESSION: Subdural hematoma over the left anterior frontal convexity measures 1.7 cm in thickness. It is most likely subacute or chronic, but is new compared with the previous CT from May 25, 2022. Electronically Signed   By: Nancyann Burns M.D.   On: 09/28/2023 11:09   CT Head Wo Contrast Result Date: 09/28/2023 EXAM: CT HEAD WITHOUT CONTRAST 09/28/2023 02:02:32 AM TECHNIQUE: CT of the head was performed without the administration of intravenous contrast. Automated exposure control, iterative reconstruction, and/or weight based adjustment of the mA/kV was utilized to reduce the radiation dose to as low as reasonably achievable. COMPARISON: Comparison with prior studies dated 09/27/2018 and 12/17/2023. CLINICAL HISTORY: Subdural hematoma. Evaluate SDH seen on earlier CT head, patient going to [facility] after CT. FINDINGS: BRAIN AND VENTRICLES: There is  an unchanged epidural hematoma along the left anterior and lateral cerebral convexity, measuring 16 mm in radial dimension. Some of the hemorrhage crosses the midline into the anteromedial right frontal calvarium, measuring 4 mm. There is mild mass effect on the anterior left frontal lobe with no evidence of herniation. No evidence of acute infarct or new intracranial hemorrhage is seen. The ventricular caliber is stable, with a 2 mm rightward midline shift at the septum pellucidum. ORBITS: No acute abnormality. SINUSES: No acute abnormality. SOFT TISSUES AND SKULL: No acute soft tissue abnormality. No skull fracture. IMPRESSION: 1. Unchanged epidural hematoma along the left anterior and lateral cerebral convexity, measuring 16 mm in radial dimension, with some hemorrhage crossing the midline into the anteromedial right frontal calvarium, measuring 4 mm. 2. Mild mass effect on the anterior left frontal lobe with 2 mm of rightward midline shift at the septum pellucidum. No evidence of herniation. 3. No new intracranial hemorrhage or acute infarct. Electronically signed by: Norman Gatlin MD 09/28/2023 02:14 AM EDT RP Workstation: [id number]   DG CHEST PORT 1 VIEW Result Date: 09/27/2023 CLINICAL DATA:  Fall EXAM: PORTABLE CHEST 1 VIEW COMPARISON:  05/25/2022 FINDINGS: The heart size and mediastinal contours are within normal limits. Both lungs are clear. The visualized skeletal structures are unremarkable. Hiatal hernia IMPRESSION: No active disease. Hiatal hernia. Electronically Signed   By: Luke Bun M.D.   On: 09/27/2023 22:47   CT Head Wo Contrast Result Date: 09/27/2023 CLINICAL DATA:  Head trauma abnormal mental status EXAM: CT HEAD WITHOUT CONTRAST TECHNIQUE: Contiguous axial images were obtained from the base of the skull through the vertex without intravenous contrast. RADIATION DOSE REDUCTION: This exam was performed according to the departmental dose-optimization program which includes automated  exposure control, adjustment of the mA and/or kV according to patient size and/or use of iterative reconstruction technique. COMPARISON:  CT brain 05/25/2022 FINDINGS: Brain: No acute territorial infarction or intracranial mass. Mixed density subdural hematoma along the left anterior  and lateral convexity, this measures 15 mm maximum thickness on sagittal series 6, image 35. Small amounts of slightly dense more acute appearing blood within the subdural, series 2, image 23. Mass effect on the underlying left frontal lobe. There is trace right anterior convexity more isodense appearing subdural collection measuring 4 mm on series 2, image 20. Suspected trace amount of subdural blood along the right anterior falx measuring 2 mm in maximum thickness on series 2, image 12. Overall stable ventricle size. About 2 mm shift to the right anteriorly. Vascular: No hyperdense vessels.  Carotid vascular calcification Skull: No fracture Sinuses/Orbits: No acute finding. Other: None Traumatic Brain Injury Risk Stratification Skull Fracture: No - Low/mBIG 1 Subdural Hematoma (SDH): 8mm plus - High/mBIG 3 Subarachnoid Hemorrhage [facility]): No Epidural Hematoma (EDH): No - Low/mBIG 1 Cerebral contusion, intra-axial, intraparenchymal Hemorrhage (IPH): No Intraventricular Hemorrhage (IVH): No - Low/mBIG 1 Midline Shift > 1mm or Edema/effacement of sulci/vents: Yes - High/mBIG 3 ---------------------------------------------------- IMPRESSION: 1. Mixed density subdural hematoma along the left anterior and lateral convexity measuring up to 15 mm maximum thickness with mass effect on the underlying left frontal lobe. About 2 mm shift to the right anteriorly. 2. Trace right anterior convexity subdural hematoma measuring 4 mm. Suspected trace amount of subdural blood along the right anterior falx measuring 2 mm in maximum thickness. Critical Value/emergent results were called by telephone at the time of interpretation on 09/27/2023 at 9:04 pm to  provider DAN FLOYD , who verbally acknowledged these results. Electronically Signed   By: Luke Bun M.D.   On: 09/27/2023 21:04   DG Knee Complete 4 Views Left Result Date: 09/27/2023 CLINICAL DATA:  Knee pain after fall. EXAM: LEFT KNEE - COMPLETE 4+ VIEW COMPARISON:  None Available. FINDINGS: No evidence of fracture, dislocation, or joint effusion. No evidence of arthropathy or other focal bone abnormality. Peripheral vascular calcifications. IMPRESSION: 1. No fracture or dislocation of the left knee. 2. Peripheral vascular disease. Electronically Signed   By: Andrea Gasman M.D.   On: 09/27/2023 18:20           LOS: 1 day   Time spent= 35 mins    Burgess JAYSON Dare, MD [facility]  If 7PM-7AM, please contact night-coverage  09/29/2023, 10:48 AM

## 2023-09-29 NOTE — Plan of Care (Signed)
  Problem: Education: Goal: Knowledge of General Education information will improve Description: Including pain rating scale, medication(s)/side effects and non-pharmacologic comfort measures Outcome: Progressing   Problem: Health Behavior/Discharge Planning: Goal: Ability to manage health-related needs will improve Outcome: Progressing   Problem: Clinical Measurements: Goal: Ability to maintain clinical measurements within normal limits will improve Outcome: Progressing Goal: Will remain free from infection Outcome: Progressing Goal: Diagnostic test results will improve Outcome: Progressing Goal: Respiratory complications will improve Outcome: Progressing Goal: Cardiovascular complication will be avoided Outcome: Progressing   Problem: Activity: Goal: Risk for activity intolerance will decrease Outcome: Progressing   Problem: Nutrition: Goal: Adequate nutrition will be maintained Outcome: Progressing   Problem: Coping: Goal: Level of anxiety will decrease Outcome: Progressing   Problem: Elimination: Goal: Will not experience complications related to bowel motility Outcome: Progressing Goal: Will not experience complications related to urinary retention Outcome: Progressing   Problem: Pain Managment: Goal: General experience of comfort will improve and/or be controlled Outcome: Progressing   Problem: Safety: Goal: Ability to remain free from injury will improve Outcome: Progressing   Problem: Skin Integrity: Goal: Risk for impaired skin integrity will decrease Outcome: Progressing   Problem: Education: Goal: Ability to state activities that reduce stress will improve Outcome: Progressing   Problem: Coping: Goal: Ability to identify and develop effective coping behavior will improve Outcome: Progressing   Problem: Self-Concept: Goal: Ability to identify factors that promote anxiety will improve Outcome: Progressing Goal: Level of anxiety will  decrease Outcome: Progressing Goal: Ability to modify response to factors that promote anxiety will improve Outcome: Progressing

## 2023-09-29 NOTE — TOC CAGE-AID Note (Signed)
 Transition of Care [facility]) - CAGE-AID Screening   Patient Details  Name: Eddie Harris MRN: [id number] Date of Birth: 08-Jun-1960  Transition of Care [facility]) CM/SW Contact:    Ferrell Claiborne E Keaghan Bowens, LCSW Phone Number: 09/29/2023, 10:01 AM   Clinical Narrative: Patient reports drinking a few beers 3 days per week. Patient denies other substance use. Patient denies SA resource needs.   CAGE-AID Screening:    Have You Ever Felt You Ought to Cut Down on Your Drinking or Drug Use?: No Have People Annoyed You By Critizing Your Drinking Or Drug Use?: No Have You Felt Bad Or Guilty About Your Drinking Or Drug Use?: No Have You Ever Had a Drink or Used Drugs First Thing In The Morning to Steady Your Nerves or to Get Rid of a Hangover?: No CAGE-AID Score: 0  Substance Abuse Education Offered: Yes

## 2023-09-30 LAB — COMPREHENSIVE METABOLIC PANEL WITH GFR
ALT: 112 U/L — ABNORMAL HIGH (ref 0–44)
ALT: 97 U/L — ABNORMAL HIGH (ref 0–44)
AST: 808 U/L — ABNORMAL HIGH (ref 15–41)
AST: 579 U/L — ABNORMAL HIGH (ref 15–41)
Albumin: 3.6 g/dL (ref 3.5–5.0)
Albumin: 3.1 g/dL — ABNORMAL LOW (ref 3.5–5.0)
Alkaline Phosphatase: 44 U/L (ref 38–126)
Alkaline Phosphatase: 39 U/L (ref 38–126)
Anion gap: 15 (ref 5–15)
Anion gap: 12 (ref 5–15)
BUN: 8 mg/dL (ref 8–23)
BUN: 6 mg/dL — ABNORMAL LOW (ref 8–23)
CO2: 26 mmol/L (ref 22–32)
CO2: 22 mmol/L (ref 22–32)
Calcium: 9.4 mg/dL (ref 8.9–10.3)
Calcium: 8.7 mg/dL — ABNORMAL LOW (ref 8.9–10.3)
Chloride: 98 mmol/L (ref 98–111)
Chloride: 104 mmol/L (ref 98–111)
Creatinine, Ser: 1.02 mg/dL (ref 0.61–1.24)
Creatinine, Ser: 0.89 mg/dL (ref 0.61–1.24)
GFR, Estimated: >60 mL/min (ref >60)
GFR, Estimated: >60 mL/min (ref >60)
Glucose, Bld: 107 mg/dL — ABNORMAL HIGH (ref 70–99)
Glucose, Bld: 102 mg/dL — ABNORMAL HIGH (ref 70–99)
Potassium: 4.3 mmol/L (ref 3.5–5.1)
Potassium: 3.9 mmol/L (ref 3.5–5.1)
Sodium: 139 mmol/L (ref 135–145)
Sodium: 138 mmol/L (ref 135–145)
Total Bilirubin: 1.8 mg/dL — ABNORMAL HIGH (ref 0.0–1.2)
Total Bilirubin: 1.3 mg/dL — ABNORMAL HIGH (ref 0.0–1.2)
Total Protein: 7 g/dL (ref 6.5–8.1)
Total Protein: 6.1 g/dL — ABNORMAL LOW (ref 6.5–8.1)

## 2023-09-30 LAB — CBC
HCT: 43.1 % (ref 39.0–52.0)
Hemoglobin: 15 g/dL (ref 13.0–17.0)
MCH: 34.2 pg — ABNORMAL HIGH (ref 26.0–34.0)
MCHC: 34.8 g/dL (ref 30.0–36.0)
MCV: 98.2 fL (ref 80.0–100.0)
Platelets: 181 K/uL (ref 150–400)
RBC: 4.39 MIL/uL (ref 4.22–5.81)
RDW: 12.4 % (ref 11.5–15.5)
WBC: 9.4 K/uL (ref 4.0–10.5)
nRBC: 0 % (ref 0.0–0.2)

## 2023-09-30 LAB — VITAMIN B1: Vitamin B1 (Thiamine): 104.4 nmol/L (ref 66.5–200.0)

## 2023-09-30 LAB — CK
Total CK: >20000 U/L — ABNORMAL HIGH (ref 49–397)
Total CK: >20000 U/L — ABNORMAL HIGH (ref 49–397)

## 2023-09-30 LAB — MAGNESIUM: Magnesium: 1.9 mg/dL (ref 1.7–2.4)

## 2023-09-30 MED ORDER — SODIUM CHLORIDE 0.9 % IV SOLN: INTRAVENOUS | Status: AC

## 2023-09-30 MED ORDER — THIAMINE MONONITRATE 100 MG PO TABS
200.0000 mg | ORAL_TABLET | Freq: Every day | ORAL | Status: DC
  Administered 2023-09-30 – 2023-10-04 (×7): 200 mg via ORAL
  Filled 2023-09-30 (×5): qty 2

## 2023-09-30 MED ORDER — SODIUM CHLORIDE 0.9 % IV BOLUS
1000.0000 mL | Freq: Once | INTRAVENOUS | Status: AC
  Administered 2023-09-30: 1000 mL via INTRAVENOUS

## 2023-09-30 NOTE — Plan of Care (Signed)
  Problem: Education: Goal: Knowledge of General Education information will improve Description: Including pain rating scale, medication(s)/side effects and non-pharmacologic comfort measures Outcome: Progressing   Problem: Health Behavior/Discharge Planning: Goal: Ability to manage health-related needs will improve Outcome: Progressing   Problem: Clinical Measurements: Goal: Ability to maintain clinical measurements within normal limits will improve Outcome: Progressing Goal: Will remain free from infection Outcome: Progressing Goal: Diagnostic test results will improve Outcome: Progressing Goal: Respiratory complications will improve Outcome: Progressing Goal: Cardiovascular complication will be avoided Outcome: Progressing   Problem: Activity: Goal: Risk for activity intolerance will decrease Outcome: Progressing   Problem: Nutrition: Goal: Adequate nutrition will be maintained Outcome: Progressing   Problem: Coping: Goal: Level of anxiety will decrease Outcome: Progressing   Problem: Elimination: Goal: Will not experience complications related to bowel motility Outcome: Progressing Goal: Will not experience complications related to urinary retention Outcome: Progressing   Problem: Pain Managment: Goal: General experience of comfort will improve and/or be controlled Outcome: Progressing   Problem: Safety: Goal: Ability to remain free from injury will improve Outcome: Progressing   Problem: Skin Integrity: Goal: Risk for impaired skin integrity will decrease Outcome: Progressing   Problem: Education: Goal: Ability to state activities that reduce stress will improve Outcome: Progressing   Problem: Coping: Goal: Ability to identify and develop effective coping behavior will improve Outcome: Progressing   Problem: Self-Concept: Goal: Ability to identify factors that promote anxiety will improve Outcome: Progressing Goal: Level of anxiety will  decrease Outcome: Progressing Goal: Ability to modify response to factors that promote anxiety will improve Outcome: Progressing

## 2023-09-30 NOTE — Progress Notes (Signed)
   09/30/23 1739  Spiritual Encounters  Type of Visit Initial  Care provided to: Patient  Referral source Chaplain team  Reason for visit Routine spiritual support  OnCall Visit Yes  Spiritual Framework  Patient Stress Factors None identified  Interventions  Spiritual Care Interventions Made Compassionate presence;Established relationship of care and support  Intervention Outcomes  Outcomes Awareness of support   Chaplain stopped by to visit the Pt, who was awake and alert. Pt stated that no spiritual care services were needed at this time but would inform the medical  staff if that changed.   During the visit, the Pt's alarm sounded on medical machine, chaplain assisted by resetting it, which the Pt appreciated as it stopped the beeping noise. Chaplain reminded the Pt to notify the medical staff if any needs arise.

## 2023-09-30 NOTE — Progress Notes (Signed)
 OT Cancellation Note  Patient Details Name: Eddie Harris MRN: [id number] DOB: [birth year]   Cancelled Treatment:    Reason Eval/Treat Not Completed: Patient declined, no reason specified.  Patient declines OOB, stating PT will be back shortly to walk, and not interested in much more.  OT has already checked times two.  OT may stop back again, but doubtful.    Raymund Manrique D Angel Hobdy 09/30/2023, 12:58 PM 09/30/2023  RP, OTR/L  Acute Rehabilitation Services  Office:  [phone]

## 2023-09-30 NOTE — Progress Notes (Addendum)
 PROGRESS NOTE  Eddie Harris  FMW:[id number] DOB: [birth year] DOA: 09/27/2023 PCP: Lazoff, Shawn P, DO  Consultants  Addendum 09/30/2023 6:16 PM Came by to check on pt this PM.  Muscle soreness is abating.  AST downtrending but CK remains elevated.  Will continue another NS bolus (second one this PM heading into the evening) and then restart IVF at 200 cc/hr, recheck labs in AM.  He feels much better with the fluids.  Urine still tea-colored despite IVF.  Creatinine recheck remains good.   Brief Narrative: 63 year old with history of alcohol use, HTN presented to the hospital with inability to walk and mechanical fall at home causing left knee pain.  Unclear exactly how long he was down on the ground.  CT of the head showed bilateral subdural hematoma with 2 mm shift.  EDP discussed case with neurosurgery who recommended neurochecks and repeat CT head.  He reportedly drinks 3-4 times weekly.  Repeat scan overall was stable therefore neurosurgery recommended outpatient CT scan in 2 weeks.  Was getting IV fluids for rhabdomyolysis, on alcohol withdrawal protocol and high-dose thiamine  for total of 3 days.  It is unclear when the IV fluids were stopped, his CK has been trending upwards for the past 24 to 48 hours.   Assessment & Plan: Rhabdomyolysis Metabolic acidosis -Admission CK 1006--> 2479-->10K--> >20K today.   - No IVF running currently.  Does report bilateral thigh soreness.  Somewhat tender ovation but soft without rigidity or signs of compartment syndrome thighs or calves bilaterally, same with upper extremities fortunately. - Restarted with fluid bolus and will be continue IV fluids with 150 cc per/hour - Repeating CK this afternoon to trend.  Potassium is good and creatinine is normal  Transaminitis: - AST/ALT both trending upwards. - Could also be signs of muscle breakdown as above.  - As above we are starting IV fluid bolus/running IV fluids.  Mechanical fall causing subdural  hematoma - Reason for initial admission  - initial CT head showed subdural hematoma with 2 mm midline shift.  Repeat CT head suggestive of similar findings at this time.   - Neurosurgery has been consulted, Dr. Darnella.  Will continue supportive care, avoid any anticoagulation or aspirin at this time. - Repeat CT head in 2 weeks.    Left knee pain -Likely from fall.  X-ray are unremarkable   Alcohol use/transaminitis - Monitor for alcohol withdrawal symptoms.  Alcohol withdrawal protocol. Librium  taper.  Careful with Librium /benzodiazepines in light of rising liver function testing. - Psych recommended outpatient referral.   Ataxia -Suspect alcohol related.  MRI brain shows stable bleed. B12, folate and TSH are normal.  Started high-dose thiamine  X 3 days - Continue oral thiamine    Essential hypertension -On Toprol -XL.  IV as needed.    DVT prophylaxis:  SCDs Start: 09/28/23 0226  Code Status:   Code Status: Full Code Level of care: Telemetry Medical Status is: Inpatient   Consults called: n/a   Subjective: Patient lying in bed.  No complaints today, except he does report some muscle soreness when directly asked in his thighs.  Also still with some weakness but his tremor/shakiness has resolved.  Eating and drinking well.  He is unable to remember when his IV fluids were stopped  Objective: Vitals:   09/30/23 0300 09/30/23 0400 09/30/23 0800 09/30/23 1300  BP: 116/85 117/83 124/88 116/83  Pulse: 80 76 67 70  Resp: 14  20 18   Temp:   98 F (36.7 C) 98 F (36.7  C)  TempSrc:   Oral Oral  SpO2: 94%  90% 96%  Weight:      Height:        Intake/Output Summary (Last 24 hours) at 09/30/2023 1318 Last data filed at 09/30/2023 0353 Gross per 24 hour  Intake 480 ml  Output 750 ml  Net -270 ml   Filed Weights   09/28/23 0235  Weight: 99 kg   Body mass index is 32.23 kg/m.  Gen: 63 y.o. male in no apparent distress.  Nontoxic Pulm: Non-labored breathing.  Clear to  auscultation bilaterally.  CV: Regular rate and rhythm.  GI: Abdomen soft, non-tender, non-distended Ext: Warm, no deformities, no pedal edema MSK: Calves nontender bilaterally with no rigidity or tenseness.  Thighs bilaterally tender to moderate palpation without rigidity.  Able to move arms symmetrically bilaterally without pain and no tenderness of our muscles. Skin: No rashes, lesions  Neuro: Alert and oriented x 4. No focal neurological deficits. Psych: Calm, became somewhat anxious appearing as we discussed his CK levels rising.  Judgement and insight appear normal. Mood & affect appropriate.     I have personally reviewed the following labs and images: CBC: Recent Labs  Lab 09/27/23 2020 09/28/23 0620 09/29/23 0559 09/30/23 0602  WBC 12.1* 10.7* 8.2 9.4  NEUTROABS 8.1*  --   --   --   HGB 15.7 14.3 14.6 15.0  HCT 45.9 40.3 41.5 43.1  MCV 98.1 96.6 97.9 98.2  PLT 223 187 155 181   BMP &GFR Recent Labs  Lab 09/27/23 2020 09/27/23 2223 09/28/23 0620 09/29/23 0559 09/30/23 0602  NA 135  --  134* 136 139  K 3.5  --  3.5 3.9 4.3  CL 99  --  102 100 98  CO2 19*  --  18* 22 26  GLUCOSE 102*  --  96 98 107*  BUN 6*  --  <5* 5* 8  CREATININE 0.98  --  0.92 0.95 1.02  CALCIUM  8.9  --  8.8* 9.1 9.4  MG  --  2.0 1.6* 1.9 1.9  PHOS  --  2.3* 4.5  --   --    Estimated Creatinine Clearance: 87.1 mL/min (by C-G formula based on SCr of 1.02 mg/dL). Liver & Pancreas: Recent Labs  Lab 09/27/23 2020 09/28/23 0620 09/29/23 0559 09/30/23 0602  AST 71* 76* 173* 808*  ALT 57* 51* 51* 112*  ALKPHOS 55 42 43 44  BILITOT 0.9 1.6* 2.2* 1.8*  PROT 7.7 6.7 6.7 7.0  ALBUMIN 4.3 3.8 3.6 3.6   No results for input(s): LIPASE, AMYLASE in the last 168 hours. Recent Labs  Lab 09/27/23 2234  AMMONIA 18   Diabetic: No results for input(s): HGBA1C in the last 72 hours. No results for input(s): GLUCAP in the last 168 hours. Cardiac Enzymes: Recent Labs  Lab 09/27/23 2223  09/28/23 0620 09/29/23 0559 09/30/23 0602  CKTOTAL 1,006* 2,479* 10,561* >20,000*   No results for input(s): PROBNP in the last 8760 hours. Coagulation Profile: Recent Labs  Lab 09/27/23 2233  INR 0.9   Thyroid Function Tests: Recent Labs    09/27/23 2233  TSH 2.728   Lipid Profile: Recent Labs    09/28/23 0620  CHOL 213*  HDL 104  LDLCALC 95  TRIG 68  CHOLHDL 2.0   Anemia Panel: Recent Labs    09/27/23 2220 09/27/23 2233 09/28/23 0620  VITAMINB12  --   --  352  FOLATE  --   --  29.4  FERRITIN  --  209  --   TIBC  --  315  --   IRON  --  85  --   RETICCTPCT 1.9  --   --    Urine analysis:    Component Value Date/Time   COLORURINE STRAW (A) 09/27/2023 2328   APPEARANCEUR CLEAR 09/27/2023 2328   LABSPEC 1.003 (L) 09/27/2023 2328   LABSPEC 1.005 12/01/2017 1443   PHURINE 6.0 09/27/2023 2328   GLUCOSEU NEGATIVE 09/27/2023 2328   HGBUR SMALL (A) 09/27/2023 2328   BILIRUBINUR NEGATIVE 09/27/2023 2328   BILIRUBINUR negative 12/01/2017 1443   BILIRUBINUR neg 04/26/2013 1127   KETONESUR 20 (A) 09/27/2023 2328   PROTEINUR NEGATIVE 09/27/2023 2328   UROBILINOGEN negative 04/26/2013 1127   NITRITE NEGATIVE 09/27/2023 2328   LEUKOCYTESUR NEGATIVE 09/27/2023 2328   Sepsis Labs: Invalid input(s): PROCALCITONIN, LACTICIDVEN  Microbiology: No results found for this or any previous visit (from the past 240 hours).  Radiology Studies: No results found.  Scheduled Meds:  chlordiazePOXIDE   25 mg Oral Q1500   escitalopram   20 mg Oral QHS   folic acid   1 mg Oral Daily   metoprolol  succinate  100 mg Oral Daily   multivitamin with minerals  1 tablet Oral Daily   Continuous Infusions:  sodium chloride  150 mL/hr at 09/30/23 0957     LOS: 2 days   35 minutes with more than 50% spent in reviewing records, counseling patient/family and coordinating care.  Eddie VEAR Gaw, MD [facility] [email or web address] 09/30/2023, 1:18 PM

## 2023-09-30 NOTE — Progress Notes (Signed)
 Physical Therapy Treatment Patient Details Name: Eddie Harris MRN: [id number] DOB: [birth year] Today's Date: 09/30/2023   History of Present Illness Pt is a 63 y.o. male presenting 8/5 after fall at home. Initial imaging negative, but fell again in ED and CTH with bil SDH along L anterior and lateral convexity measuring up to 15 mm thickness with mass effect on underlying left frontal lobe; trace right anterior convexity subdural hematoma measuring 4 mm. PMH: alcohol use, HTN, GERD, HLD, p-Afib.    PT Comments  Progressing with balance and knee stability able to walk in hallway with 1 hand support on rail with S.  Still weak and feels knees are buckley without UE support.  Will continue to follow and attempt with cane at next session.    If plan is discharge home, recommend the following: A little help with walking and/or transfers;Assist for transportation;Assistance with cooking/housework   Can travel by private vehicle        Equipment Recommendations  Rolling walker (2 wheels)    Recommendations for Other Services       Precautions / Restrictions Precautions Precautions: Fall Precaution/Restrictions Comments: watch HR (CIWA)     Mobility  Bed Mobility               General bed mobility comments: already sitting EOB upon PT entry    Transfers Overall transfer level: Needs assistance Equipment used: None Transfers: Sit to/from Stand Sit to Stand: Supervision           General transfer comment: standing from EOB initially no device with S holding bed rail, stood x 5 end of session with intermittent UE support with S for LE strengthening    Ambulation/Gait Ambulation/Gait assistance: Supervision, Contact guard assist Gait Distance (Feet): 400 Feet Assistive device: Rolling walker (2 wheels) (vs wall rail) Gait Pattern/deviations: Step-through pattern, Decreased stride length       General Gait Details: using RW initially then in hallway used wall rail with  CGA to S for practice with less assistive device   Stairs Stairs: Yes Stairs assistance: Supervision Stair Management: Forwards, With walker Number of Stairs: 1 General stair comments: cues for sequencing with walker on curb step   Wheelchair Mobility     Tilt Bed    Modified Rankin (Stroke Patients Only)       Balance Overall balance assessment: Needs assistance   Sitting balance-Leahy Scale: Good       Standing balance-Leahy Scale: Fair Standing balance comment: standing at bedside initially no UE support                            Communication Communication Communication: No apparent difficulties  Cognition Arousal: Alert Behavior During Therapy: WFL for tasks assessed/performed   PT - Cognitive impairments: No apparent impairments                         Following commands: Intact      Cueing Cueing Techniques: Verbal cues  Exercises Other Exercises Other Exercises: sit<>stand x 5 minimal UE support    General Comments General comments (skin integrity, edema, etc.): HR 101 with ambulation; demosntrated maxed out volume with incentive spirometer.  Discussed options for help with managing health while going through very difficulty divorce.      Pertinent Vitals/Pain Pain Assessment Pain Assessment: Faces Faces Pain Scale: Hurts a little bit Pain Location: R knee Pain Descriptors / Indicators: Aching, Sore  Pain Intervention(s): Monitored during session    Home Living                          Prior Function            PT Goals (current goals can now be found in the care plan section) Progress towards PT goals: Progressing toward goals    Frequency    Min 3X/week      PT Plan      Co-evaluation              AM-PAC PT 6 Clicks Mobility   Outcome Measure  Help needed turning from your back to your side while in a flat bed without using bedrails?: None Help needed moving from lying on your back  to sitting on the side of a flat bed without using bedrails?: None Help needed moving to and from a bed to a chair (including a wheelchair)?: A Little Help needed standing up from a chair using your arms (e.g., wheelchair or bedside chair)?: None Help needed to walk in hospital room?: A Little Help needed climbing 3-5 steps with a railing? : A Little 6 Click Score: 21    End of Session Equipment Utilized During Treatment: Gait belt Activity Tolerance: Patient tolerated treatment well Patient left: in bed;with call bell/phone within reach   PT Visit Diagnosis: Other abnormalities of gait and mobility (R26.89);Ataxic gait (R26.0);History of falling (Z91.81)     Time: 8472-8448 PT Time Calculation (min) (ACUTE ONLY): 24 min  Charges:    $Gait Training: 8-22 mins $Therapeutic Activity: 8-22 mins PT General Charges $$ ACUTE PT VISIT: 1 Visit                     Micheline Portal, PT Acute Rehabilitation Services [phone] 09/30/2023    [name] 09/30/2023, 5:58 PM

## 2023-10-01 LAB — CBC
HCT: 38 % — ABNORMAL LOW (ref 39.0–52.0)
Hemoglobin: 13.3 g/dL (ref 13.0–17.0)
MCH: 34.5 pg — ABNORMAL HIGH (ref 26.0–34.0)
MCHC: 35 g/dL (ref 30.0–36.0)
MCV: 98.4 fL (ref 80.0–100.0)
Platelets: 154 K/uL (ref 150–400)
RBC: 3.86 MIL/uL — ABNORMAL LOW (ref 4.22–5.81)
RDW: 12.4 % (ref 11.5–15.5)
WBC: 7.2 K/uL (ref 4.0–10.5)
nRBC: 0 % (ref 0.0–0.2)

## 2023-10-01 LAB — URINALYSIS, COMPLETE (UACMP) WITH MICROSCOPIC
Bacteria, UA: NONE SEEN
Bilirubin Urine: NEGATIVE
Glucose, UA: NEGATIVE mg/dL
Ketones, ur: NEGATIVE mg/dL
Leukocytes,Ua: NEGATIVE
Nitrite: NEGATIVE
Protein, ur: NEGATIVE mg/dL
Specific Gravity, Urine: 1.004 — ABNORMAL LOW (ref 1.005–1.030)
pH: 6 (ref 5.0–8.0)

## 2023-10-01 LAB — COMPREHENSIVE METABOLIC PANEL WITH GFR
ALT: 102 U/L — ABNORMAL HIGH (ref 0–44)
ALT: 114 U/L — ABNORMAL HIGH (ref 0–44)
AST: 647 U/L — ABNORMAL HIGH (ref 15–41)
AST: 713 U/L — ABNORMAL HIGH (ref 15–41)
Albumin: 2.9 g/dL — ABNORMAL LOW (ref 3.5–5.0)
Albumin: 3.3 g/dL — ABNORMAL LOW (ref 3.5–5.0)
Alkaline Phosphatase: 37 U/L — ABNORMAL LOW (ref 38–126)
Alkaline Phosphatase: 42 U/L (ref 38–126)
Anion gap: 8 (ref 5–15)
Anion gap: 14 (ref 5–15)
BUN: 5 mg/dL — ABNORMAL LOW (ref 8–23)
BUN: <5 mg/dL — ABNORMAL LOW (ref 8–23)
CO2: 23 mmol/L (ref 22–32)
CO2: 22 mmol/L (ref 22–32)
Calcium: 8.4 mg/dL — ABNORMAL LOW (ref 8.9–10.3)
Calcium: 8.8 mg/dL — ABNORMAL LOW (ref 8.9–10.3)
Chloride: 107 mmol/L (ref 98–111)
Chloride: 103 mmol/L (ref 98–111)
Creatinine, Ser: 0.83 mg/dL (ref 0.61–1.24)
Creatinine, Ser: 0.78 mg/dL (ref 0.61–1.24)
GFR, Estimated: >60 mL/min (ref >60)
GFR, Estimated: >60 mL/min (ref >60)
Glucose, Bld: 113 mg/dL — ABNORMAL HIGH (ref 70–99)
Glucose, Bld: 121 mg/dL — ABNORMAL HIGH (ref 70–99)
Potassium: 3.4 mmol/L — ABNORMAL LOW (ref 3.5–5.1)
Potassium: 4 mmol/L (ref 3.5–5.1)
Sodium: 138 mmol/L (ref 135–145)
Sodium: 139 mmol/L (ref 135–145)
Total Bilirubin: 0.9 mg/dL (ref 0.0–1.2)
Total Bilirubin: 0.9 mg/dL (ref 0.0–1.2)
Total Protein: 5.8 g/dL — ABNORMAL LOW (ref 6.5–8.1)
Total Protein: 6.3 g/dL — ABNORMAL LOW (ref 6.5–8.1)

## 2023-10-01 LAB — MAGNESIUM: Magnesium: 1.6 mg/dL — ABNORMAL LOW (ref 1.7–2.4)

## 2023-10-01 LAB — CK
Total CK: >20000 U/L — ABNORMAL HIGH (ref 49–397)
Total CK: >20000 U/L — ABNORMAL HIGH (ref 49–397)

## 2023-10-01 MED ORDER — SODIUM CHLORIDE 0.9 % IV SOLN: INTRAVENOUS | Status: DC

## 2023-10-01 MED ORDER — LORAZEPAM 1 MG PO TABS
1.0000 mg | ORAL_TABLET | Freq: Once | ORAL | Status: AC
  Administered 2023-10-01: 1 mg via ORAL
  Filled 2023-10-01: qty 1

## 2023-10-01 MED ORDER — SODIUM CHLORIDE 0.9 % IV BOLUS
1000.0000 mL | Freq: Once | INTRAVENOUS | Status: AC
  Administered 2023-10-01: 1000 mL via INTRAVENOUS

## 2023-10-01 MED ORDER — POTASSIUM CHLORIDE CRYS ER 20 MEQ PO TBCR
40.0000 meq | EXTENDED_RELEASE_TABLET | Freq: Once | ORAL | Status: AC
  Administered 2023-10-01: 40 meq via ORAL
  Filled 2023-10-01: qty 2

## 2023-10-01 MED ORDER — MAGNESIUM SULFATE 2 GM/50ML IV SOLN
2.0000 g | Freq: Once | INTRAVENOUS | Status: AC
  Administered 2023-10-01: 2 g via INTRAVENOUS
  Filled 2023-10-01: qty 50

## 2023-10-01 NOTE — Plan of Care (Signed)
 Stable vs; received NS 1 liter bolus, adequate urine output; CIWA zero Problem: Education: Goal: Knowledge of General Education information will improve Description: Including pain rating scale, medication(s)/side effects and non-pharmacologic comfort measures Outcome: Progressing   Problem: Health Behavior/Discharge Planning: Goal: Ability to manage health-related needs will improve Outcome: Progressing   Problem: Clinical Measurements: Goal: Ability to maintain clinical measurements within normal limits will improve Outcome: Progressing Goal: Will remain free from infection Outcome: Progressing Goal: Diagnostic test results will improve Outcome: Progressing Goal: Respiratory complications will improve Outcome: Progressing Goal: Cardiovascular complication will be avoided Outcome: Progressing   Problem: Activity: Goal: Risk for activity intolerance will decrease Outcome: Progressing   Problem: Nutrition: Goal: Adequate nutrition will be maintained Outcome: Progressing   Problem: Coping: Goal: Level of anxiety will decrease Outcome: Progressing   Problem: Elimination: Goal: Will not experience complications related to bowel motility Outcome: Progressing Goal: Will not experience complications related to urinary retention Outcome: Progressing   Problem: Pain Managment: Goal: General experience of comfort will improve and/or be controlled Outcome: Progressing   Problem: Safety: Goal: Ability to remain free from injury will improve Outcome: Progressing   Problem: Skin Integrity: Goal: Risk for impaired skin integrity will decrease Outcome: Progressing   Problem: Education: Goal: Ability to state activities that reduce stress will improve Outcome: Progressing   Problem: Coping: Goal: Ability to identify and develop effective coping behavior will improve Outcome: Progressing   Problem: Self-Concept: Goal: Ability to identify factors that promote anxiety will  improve Outcome: Progressing Goal: Level of anxiety will decrease Outcome: Progressing Goal: Ability to modify response to factors that promote anxiety will improve Outcome: Progressing

## 2023-10-01 NOTE — Progress Notes (Signed)
 PT Cancellation Note  Patient Details Name: Eddie Harris MRN: [id number] DOB: 02-13-1961   Cancelled Treatment:    Reason Eval/Treat Not Completed: Patient not medically ready. MD reached out to PT, reporting that with the pt's rhabdo and his CK remaining >20K, they would rather the pt not mobilize out in the halls over the weekend and reported therapy can hold off for the weekend to allow the pt's muscles to rest. Will plan to follow-up on Monday as able.   Theo Ferretti, PT, DPT Acute Rehabilitation Services  Office: [phone]    Theo CHRISTELLA Ferretti 10/01/2023, 1:22 PM

## 2023-10-01 NOTE — Plan of Care (Signed)
  Problem: Education: Goal: Knowledge of General Education information will improve Description: Including pain rating scale, medication(s)/side effects and non-pharmacologic comfort measures Outcome: Progressing   Problem: Nutrition: Goal: Adequate nutrition will be maintained Outcome: Progressing   Problem: Coping: Goal: Level of anxiety will decrease Outcome: Progressing   Problem: Elimination: Goal: Will not experience complications related to urinary retention Outcome: Progressing   Problem: Pain Managment: Goal: General experience of comfort will improve and/or be controlled Outcome: Progressing

## 2023-10-01 NOTE — Progress Notes (Signed)
 PROGRESS NOTE  Eddie Harris  FMW:[id number] DOB: 07/23/1960 DOA: 09/27/2023 PCP: Lazoff, Shawn P, DO  [facility]  Addendum 10/01/2023 11:16 AM Came by to check on pt this PM.  Muscle soreness is abating.  AST downtrending but CK remains elevated.  Will continue another NS bolus (second one this PM heading into the evening) and then restart IVF at 200 cc/hr, recheck labs in AM.  He feels much better with the fluids.  Urine still tea-colored despite IVF.  Creatinine recheck remains good.   Brief Narrative: 63 year old with history of alcohol use, HTN presented to the hospital with inability to walk and mechanical fall at home causing left knee pain.  Unclear exactly how long he was down on the ground.  CT of the head showed bilateral subdural hematoma with 2 mm shift.  EDP discussed case with neurosurgery who recommended neurochecks and repeat CT head.  He reportedly drinks 62-4 times weekly.  Repeat scan overall was stable therefore neurosurgery recommended outpatient CT scan in 2 weeks.  Was getting IV fluids for rhabdomyolysis, on alcohol withdrawal protocol and high-dose thiamine  for total of 3 days.  It is unclear when the IV fluids were stopped, his CK has been trending upwards since admission.   Assessment & Plan: Rhabdomyolysis -Admission CK 1006--> 2479-->10K--> >20K and has remained as such - He had no IV fluids running since since 8/5 as far as I can tell from med records.   - Restarted yesterday 8/8.  2 NS fluid boluses plus continuous IV fluids at 150 cc/hour, increased to 200 cc/hour yesterday afternoon.   -Trending CK and LFTs.  Slight dip in LFTs but remaining elevated, see below -Hanging another fluid bolus this morning.  Urine output has been about 6 L over the past 24 hours.  Goal urine output and rhabdomyolysis is 200 to 300 cc/hour.  Output on 8/6 with 600 cc and 8/7 was 850 cc without IVF running.  Urine in urinal beside bed slightly less dark than yesterday afternoon.  - We're  behind on his fluid status as tx for rhabdomyolysis, with minimal urine output after admission.  It's picked up briskly and fortunately his kidney function has remained great. - He has been walking up and down the halls and does report continued bilateral side soreness.  I think today goal is for just rest.  He can sit in chair beside bed and maybe walk around the room but not up and down the halls to allow his muscles to recover and heal. -No evidence of coronary syndrome on exam. - Repeating CK this afternoon to trend.  Potassium is good and creatinine is normal  Relative rest:   - As above, he is not strictly bedrest and can sit in chair beside bed to keep his strength up but plan for next 24 hours is rest rather than walking up and down the halls to allow muscles to heal  Transaminitis: - AST/ALT both trending upwards--> some decrease from yesterday morning.  I did touch base with GI Dr. Albertus who was reassured by patient's normal bilirubin.   - Elevated AST/ALT most likely secondary to muscle breakdown. - Continue IV fluids as above.  Mechanical fall causing subdural hematoma - Reason for initial admission  - initial CT head showed subdural hematoma with 2 mm midline shift.  Repeat CT head suggestive of similar findings at this time.   - Neurosurgery consulted, Dr. Darnella.  Will continue supportive care, avoid any anticoagulation or aspirin at this time. -  He's been stable, and plan is repeat CT head in 2 weeks.  -Close monitoring of sodium and other electrolytes in light of his subdural hematoma and aggressive hydration to treat his rhabdomyolysis   Left knee pain -Likely from fall.  X-ray are unremarkable -Pain improving   Alcohol use - Monitor for alcohol withdrawal symptoms.  Alcohol withdrawal protocol. Librium  taper.  Careful with Librium /benzodiazepines in light of rising liver function testing. -CIWA has been normal.  Exam is normal - Psych recommended outpatient referral.    Ataxia -Suspect alcohol related.  MRI brain shows stable bleed. B12, folate and TSH are normal.  Started high-dose thiamine  X 3 days - Continue oral thiamine    Essential hypertension -On Toprol -XL.  IV as needed.    DVT prophylaxis:  SCDs Start: 09/28/23 0226  Code Status:   Code Status: Full Code Level of care: Telemetry Medical Status is: Inpatient   Consults called: n/a   Subjective: Patient lying in bed.  He did walk with PT yesterday and again this morning.  Does report bilateral thigh soreness worse with walking.  No pain in calves.  No pain in forearms or shoulders.  Eating and drinking well and working on oral hydration  Objective: Vitals:   09/30/23 2325 10/01/23 0025 10/01/23 0300 10/01/23 0737  BP: (!) 132/94 125/89 112/76 (!) 120/93  Pulse: 75 83 65 65  Resp: 16 20 18 20   Temp: 98.3 F (36.8 C)  98.2 F (36.8 C) 98.2 F (36.8 C)  TempSrc: Oral  Oral Oral  SpO2: 99%   96%  Weight:      Height:        Intake/Output Summary (Last 24 hours) at 10/01/2023 1116 Last data filed at 10/01/2023 1101 Gross per 24 hour  Intake 3651.51 ml  Output 5200 ml  Net -1548.49 ml   Filed Weights   09/28/23 0235  Weight: 99 kg   Body mass index is 32.23 kg/m.  Gen: 63 y.o. male in no apparent distress.  Nontoxic Pulm: Non-labored breathing.  Clear to auscultation bilaterally.  CV: Regular rate and rhythm.  GI: Abdomen soft, non-tender, non-distended Ext: Warm, no deformities, no pedal edema MSK: Calves nontender bilaterally with no rigidity or tenseness.  Thighs bilaterally tender to moderate palpation without rigidity, slightly less sore today than yesterday..  Able to move arms symmetrically bilaterally without pain and no tenderness of our muscles. Skin: No rashes, lesions  Neuro: Alert and oriented x 4. No focal neurological deficits. Psych: Calm, became somewhat anxious appearing as we discussed his CK levels rising.  Judgement and insight appear normal. Mood &  affect appropriate.     I have personally reviewed the following labs and images: CBC: Recent Labs  Lab 09/27/23 2020 09/28/23 0620 09/29/23 0559 09/30/23 0602 10/01/23 0552  WBC 12.1* 10.7* 8.2 9.4 7.2  NEUTROABS 8.1*  --   --   --   --   HGB 15.7 14.3 14.6 15.0 13.3  HCT 45.9 40.3 41.5 43.1 38.0*  MCV 98.1 96.6 97.9 98.2 98.4  PLT 223 187 155 181 154   BMP &GFR Recent Labs  Lab 09/27/23 2223 09/28/23 0620 09/29/23 0559 09/30/23 0602 09/30/23 1433 10/01/23 0552  NA  --  134* 136 139 138 138  K  --  3.5 3.9 4.3 3.9 3.4*  CL  --  102 100 98 104 107  CO2  --  18* 22 26 22 23   GLUCOSE  --  96 98 107* 102* 113*  BUN  --  <  5* 5* 8 6* 5*  CREATININE  --  0.92 0.95 1.02 0.89 0.83  CALCIUM   --  8.8* 9.1 9.4 8.7* 8.4*  MG 2.0 1.6* 1.9 1.9  --  1.6*  PHOS 2.3* 4.5  --   --   --   --    Estimated Creatinine Clearance: 107 mL/min (by C-G formula based on SCr of 0.83 mg/dL). Liver & Pancreas: Recent Labs  Lab 09/28/23 0620 09/29/23 0559 09/30/23 0602 09/30/23 1433 10/01/23 0552  AST 76* 173* 808* 579* 647*  ALT 51* 51* 112* 97* 102*  ALKPHOS 42 43 44 39 37*  BILITOT 1.6* 2.2* 1.8* 1.3* 0.9  PROT 6.7 6.7 7.0 6.1* 5.8*  ALBUMIN 3.8 3.6 3.6 3.1* 2.9*   No results for input(s): LIPASE, AMYLASE in the last 168 hours. Recent Labs  Lab 09/27/23 2234  AMMONIA 18   Diabetic: No results for input(s): HGBA1C in the last 72 hours. No results for input(s): GLUCAP in the last 168 hours. Cardiac Enzymes: Recent Labs  Lab 09/28/23 0620 09/29/23 0559 09/30/23 0602 09/30/23 1433 10/01/23 0552  CKTOTAL 2,479* 10,561* >20,000* >20,000* >20,000*   No results for input(s): PROBNP in the last 8760 hours. Coagulation Profile: Recent Labs  Lab 09/27/23 2233  INR 0.9   Thyroid Function Tests: No results for input(s): TSH, T4TOTAL, FREET4, T3FREE, THYROIDAB in the last 72 hours.  Lipid Profile: No results for input(s): CHOL, HDL, LDLCALC, TRIG,  CHOLHDL, LDLDIRECT in the last 72 hours.  Anemia Panel: No results for input(s): VITAMINB12, FOLATE, FERRITIN, TIBC, IRON, RETICCTPCT in the last 72 hours.  Urine analysis:    Component Value Date/Time   COLORURINE STRAW (A) 09/27/2023 2328   APPEARANCEUR CLEAR 09/27/2023 2328   LABSPEC 1.003 (L) 09/27/2023 2328   LABSPEC 1.005 12/01/2017 1443   PHURINE 6.0 09/27/2023 2328   GLUCOSEU NEGATIVE 09/27/2023 2328   HGBUR SMALL (A) 09/27/2023 2328   BILIRUBINUR NEGATIVE 09/27/2023 2328   BILIRUBINUR negative 12/01/2017 1443   BILIRUBINUR neg 04/26/2013 1127   KETONESUR 20 (A) 09/27/2023 2328   PROTEINUR NEGATIVE 09/27/2023 2328   UROBILINOGEN negative 04/26/2013 1127   NITRITE NEGATIVE 09/27/2023 2328   LEUKOCYTESUR NEGATIVE 09/27/2023 2328   Sepsis Labs: Invalid input(s): PROCALCITONIN, LACTICIDVEN  Microbiology: No results found for this or any previous visit (from the past 240 hours).  Radiology Studies: No results found.  Scheduled Meds:  escitalopram   20 mg Oral QHS   folic acid   1 mg Oral Daily   metoprolol  succinate  100 mg Oral Daily   multivitamin with minerals  1 tablet Oral Daily   thiamine   200 mg Oral Daily   Continuous Infusions:  magnesium  sulfate bolus IVPB 2 g (10/01/23 1058)     LOS: 3 days   35 minutes with more than 50% spent in reviewing records, counseling patient/family and coordinating care.  Eddie VEAR Gaw, MD [facility] [email or web address] 10/01/2023, 11:16 AM

## 2023-10-02 LAB — COMPREHENSIVE METABOLIC PANEL WITH GFR
ALT: 108 U/L — ABNORMAL HIGH (ref 0–44)
AST: 677 U/L — ABNORMAL HIGH (ref 15–41)
Albumin: 2.8 g/dL — ABNORMAL LOW (ref 3.5–5.0)
Alkaline Phosphatase: 35 U/L — ABNORMAL LOW (ref 38–126)
Anion gap: 9 (ref 5–15)
BUN: <5 mg/dL — ABNORMAL LOW (ref 8–23)
CO2: 23 mmol/L (ref 22–32)
Calcium: 8.3 mg/dL — ABNORMAL LOW (ref 8.9–10.3)
Chloride: 109 mmol/L (ref 98–111)
Creatinine, Ser: 0.8 mg/dL (ref 0.61–1.24)
GFR, Estimated: >60 mL/min (ref >60)
Glucose, Bld: 107 mg/dL — ABNORMAL HIGH (ref 70–99)
Potassium: 3.8 mmol/L (ref 3.5–5.1)
Sodium: 141 mmol/L (ref 135–145)
Total Bilirubin: 0.9 mg/dL (ref 0.0–1.2)
Total Protein: 5.6 g/dL — ABNORMAL LOW (ref 6.5–8.1)

## 2023-10-02 LAB — CBC
HCT: 37.3 % — ABNORMAL LOW (ref 39.0–52.0)
Hemoglobin: 13.2 g/dL (ref 13.0–17.0)
MCH: 34.6 pg — ABNORMAL HIGH (ref 26.0–34.0)
MCHC: 35.4 g/dL (ref 30.0–36.0)
MCV: 97.9 fL (ref 80.0–100.0)
Platelets: 171 K/uL (ref 150–400)
RBC: 3.81 MIL/uL — ABNORMAL LOW (ref 4.22–5.81)
RDW: 12.3 % (ref 11.5–15.5)
WBC: 6.9 K/uL (ref 4.0–10.5)
nRBC: 0 % (ref 0.0–0.2)

## 2023-10-02 LAB — MAGNESIUM: Magnesium: 1.7 mg/dL (ref 1.7–2.4)

## 2023-10-02 LAB — CK
Total CK: >20000 U/L — ABNORMAL HIGH (ref 49–397)
Total CK: >20000 U/L — ABNORMAL HIGH (ref 49–397)

## 2023-10-02 MED ORDER — LORAZEPAM 1 MG PO TABS
1.0000 mg | ORAL_TABLET | Freq: Every day | ORAL | Status: DC
  Administered 2023-10-02 – 2023-10-03 (×3): 1 mg via ORAL
  Filled 2023-10-02 (×2): qty 1

## 2023-10-02 MED ORDER — ORAL CARE MOUTH RINSE
15.0000 mL | OROMUCOSAL | Status: DC | PRN
Start: 2023-10-02 — End: 2023-10-04

## 2023-10-02 MED ORDER — AMLODIPINE BESYLATE 5 MG PO TABS
5.0000 mg | ORAL_TABLET | Freq: Every day | ORAL | Status: DC
  Administered 2023-10-02 – 2023-10-03 (×3): 5 mg via ORAL
  Filled 2023-10-02 (×2): qty 1

## 2023-10-02 NOTE — Progress Notes (Addendum)
 PROGRESS NOTE  Eddie Harris  FMW:[id number] DOB: 06-26-1960 DOA: 09/27/2023 PCP: Lazoff, Shawn P, DO  [facility]  Brief Narrative: 63 year old with history of alcohol use, HTN presented to the hospital with inability to walk and mechanical fall at home causing left knee pain.  Unclear exactly how long he was down on the ground.  CT of the head showed bilateral subdural hematoma with 2 mm shift.  EDP discussed case with neurosurgery who recommended neurochecks and repeat CT head.  He reportedly drinks 3-4 times weekly.  Repeat scan overall was stable therefore neurosurgery recommended outpatient CT scan in 2 weeks.  Was getting IV fluids for rhabdomyolysis, on alcohol withdrawal protocol and high-dose thiamine  for total of 3 days.    Assessment & Plan: Rhabdomyolysis -Admission CK 1006--> 2479-->10K--> >20K and has remained as such - Fluid boluses administered and ongoing 250 cc/hr.  Patient's urine has gradually cleared since Friday, was previously not on IVF.  No evidence of compartment syndrome on exam - Spoke with Dr. Albertus from GI yesterday and Dr. Geralynn from renal.   - We do not know how high his CK went because our lab stops measuring > 20K.  Renal stated that since his urine is lightening in color, CK has likely peaked and on its way down we just need to watch until it can be measured again. -Fortunately his kidney function remains great while in house. - Goal urine output and rhabdomyolysis is 200 to 300 cc/hour.  Meeting goal, had 9.2 L out 8/9 and 13.3 L out past 24 hours.  Initially we were  behind on his fluid status as tx for rhabdomyolysis, with minimal urine output after admission.  It's picked up briskly and fortunately his kidney function has remained great. - Trend CK/LFTs.  Relative rest:   - As above, he is not strictly bedrest and can sit in chair beside bed to keep his strength up  - He was previously working with physical therapy and we decided to not do that over the  weekend to allow his muscles to continue to rest and heal.  Transaminitis: - AST/ALT both trended upwards--> some decrease from yesterday morning.  I did touch base with GI Dr. Albertus who was reassured by patient's normal bilirubin and states this is all secondary to muscle breakdown..   - Continue IV fluids as above.  Mechanical fall causing subdural hematoma - Reason for initial admission  - initial CT head showed subdural hematoma with 2 mm midline shift.  Repeat CT head suggestive of similar findings at this time.   - Neurosurgery consulted, Dr. Darnella.  Will continue supportive care, avoid any anticoagulation or aspirin at this time. - He's been stable, and plan is repeat CT head in 2 weeks.  -Close monitoring of sodium and other electrolytes in light of his subdural hematoma and aggressive hydration to treat his rhabdomyolysis   Left knee pain -Likely from fall.  X-ray are unremarkable -Pain improving   Alcohol use - Monitor for alcohol withdrawal symptoms.  Alcohol withdrawal protocol. Librium  taper.  Careful with Librium /benzodiazepines in light of rising liver function testing. -CIWA has been normal.  Exam is normal - Psych recommended outpatient referral.   Ataxia -Suspect alcohol related.  MRI brain shows stable bleed. B12, folate and TSH are normal.  Started high-dose thiamine  X 3 days - Continue oral thiamine , ataxia has mostly cleared.   Essential hypertension -On Toprol -XL.  IV as needed. - Restarted home norvasc  as his diastolics have been creeping  upwards.    DVT prophylaxis:  SCDs Start: 09/28/23 0226  Code Status:   Code Status: Full Code Level of care: Telemetry Medical Status is: Inpatient   Consults called: n/a   Subjective: Patient lying in bed.  Still with some mild bilateral posterior thigh soreness but now just moderate.  He is ready to leave the hospital but understands the need to be treated.  Objective: Vitals:   10/02/23 0900 10/02/23 1000  10/02/23 1100 10/02/23 1300  BP:      Pulse: (!) 58 66 66 60  Resp: 17 (!) 23 17 19   Temp:    98 F (36.7 C)  TempSrc:      SpO2: 94% 95% 97% 93%  Weight:      Height:        Intake/Output Summary (Last 24 hours) at 10/02/2023 1349 Last data filed at 10/02/2023 1334 Gross per 24 hour  Intake 3258.27 ml  Output 5500 ml  Net -2241.73 ml   Filed Weights   09/28/23 0235  Weight: 99 kg   Body mass index is 32.23 kg/m.  Gen: 63 y.o. male in no apparent distress.  Nontoxic Pulm: Non-labored breathing.  Clear to auscultation bilaterally.  CV: Regular rate and rhythm.  GI: Abdomen soft, non-tender, non-distended Ext: Warm, no deformities, no pedal edema MSK: Calves nontender bilaterally with no rigidity or tenseness.  Thighs bilaterally tender to moderate palpation without rigidity, slightly less sore today than yesterday..  Able to move arms symmetrically bilaterally without pain and no tenderness of our muscles. Skin: No rashes, lesions  Neuro: Alert and oriented x 4. No focal neurological deficits. Psych: Calm, became somewhat anxious appearing as we discussed his CK levels rising.  Judgement and insight appear normal. Mood & affect appropriate.     I have personally reviewed the following labs and images: CBC: Recent Labs  Lab 09/27/23 2020 09/28/23 0620 09/29/23 0559 09/30/23 0602 10/01/23 0552 10/02/23 0703  WBC 12.1* 10.7* 8.2 9.4 7.2 6.9  NEUTROABS 8.1*  --   --   --   --   --   HGB 15.7 14.3 14.6 15.0 13.3 13.2  HCT 45.9 40.3 41.5 43.1 38.0* 37.3*  MCV 98.1 96.6 97.9 98.2 98.4 97.9  PLT 223 187 155 181 154 171   BMP &GFR Recent Labs  Lab 09/27/23 2223 09/28/23 0620 09/29/23 0559 09/30/23 0602 09/30/23 1433 10/01/23 0552 10/01/23 1450 10/02/23 0703  NA  --  134* 136 139 138 138 139 141  K  --  3.5 3.9 4.3 3.9 3.4* 4.0 3.8  CL  --  102 100 98 104 107 103 109  CO2  --  18* 22 26 22 23 22 23   GLUCOSE  --  96 98 107* 102* 113* 121* 107*  BUN  --  <5* 5*  8 6* 5* <5* <5*  CREATININE  --  0.92 0.95 1.02 0.89 0.83 0.78 0.80  CALCIUM   --  8.8* 9.1 9.4 8.7* 8.4* 8.8* 8.3*  MG 2.0 1.6* 1.9 1.9  --  1.6*  --  1.7  PHOS 2.3* 4.5  --   --   --   --   --   --    Estimated Creatinine Clearance: 111 mL/min (by C-G formula based on SCr of 0.8 mg/dL). Liver & Pancreas: Recent Labs  Lab 09/30/23 0602 09/30/23 1433 10/01/23 0552 10/01/23 1450 10/02/23 0703  AST 808* 579* 647* 713* 677*  ALT 112* 97* 102* 114* 108*  ALKPHOS 44 39 37*  42 35*  BILITOT 1.8* 1.3* 0.9 0.9 0.9  PROT 7.0 6.1* 5.8* 6.3* 5.6*  ALBUMIN 3.6 3.1* 2.9* 3.3* 2.8*   No results for input(s): LIPASE, AMYLASE in the last 168 hours. Recent Labs  Lab 09/27/23 2234  AMMONIA 18   Diabetic: No results for input(s): HGBA1C in the last 72 hours. No results for input(s): GLUCAP in the last 168 hours. Cardiac Enzymes: Recent Labs  Lab 09/30/23 0602 09/30/23 1433 10/01/23 0552 10/01/23 1450 10/02/23 0703  CKTOTAL >20,000* >20,000* >20,000* >20,000* >20,000*   No results for input(s): PROBNP in the last 8760 hours. Coagulation Profile: Recent Labs  Lab 09/27/23 2233  INR 0.9   Thyroid Function Tests: No results for input(s): TSH, T4TOTAL, FREET4, T3FREE, THYROIDAB in the last 72 hours.  Lipid Profile: No results for input(s): CHOL, HDL, LDLCALC, TRIG, CHOLHDL, LDLDIRECT in the last 72 hours.  Anemia Panel: No results for input(s): VITAMINB12, FOLATE, FERRITIN, TIBC, IRON, RETICCTPCT in the last 72 hours.  Urine analysis:    Component Value Date/Time   COLORURINE STRAW (A) 10/01/2023 1022   APPEARANCEUR CLEAR 10/01/2023 1022   LABSPEC 1.004 (L) 10/01/2023 1022   LABSPEC 1.005 12/01/2017 1443   PHURINE 6.0 10/01/2023 1022   GLUCOSEU NEGATIVE 10/01/2023 1022   HGBUR LARGE (A) 10/01/2023 1022   BILIRUBINUR NEGATIVE 10/01/2023 1022   BILIRUBINUR negative 12/01/2017 1443   BILIRUBINUR neg 04/26/2013 1127   KETONESUR  NEGATIVE 10/01/2023 1022   PROTEINUR NEGATIVE 10/01/2023 1022   UROBILINOGEN negative 04/26/2013 1127   NITRITE NEGATIVE 10/01/2023 1022   LEUKOCYTESUR NEGATIVE 10/01/2023 1022   Sepsis Labs: Invalid input(s): PROCALCITONIN, LACTICIDVEN  Microbiology: No results found for this or any previous visit (from the past 240 hours).  Radiology Studies: No results found.  Scheduled Meds:  amLODipine   5 mg Oral QHS   escitalopram   20 mg Oral QHS   folic acid   1 mg Oral Daily   metoprolol  succinate  100 mg Oral Daily   multivitamin with minerals  1 tablet Oral Daily   thiamine   200 mg Oral Daily   Continuous Infusions:  sodium chloride  250 mL/hr at 10/02/23 1344     LOS: 4 days   35 minutes with more than 50% spent in reviewing records, counseling patient/family and coordinating care.  Reyes VEAR Gaw, MD [facility] [email or web address] 10/02/2023, 1:49 PM

## 2023-10-03 LAB — CBC
HCT: 37 % — ABNORMAL LOW (ref 39.0–52.0)
Hemoglobin: 13 g/dL (ref 13.0–17.0)
MCH: 34.6 pg — ABNORMAL HIGH (ref 26.0–34.0)
MCHC: 35.1 g/dL (ref 30.0–36.0)
MCV: 98.4 fL (ref 80.0–100.0)
Platelets: 171 K/uL (ref 150–400)
RBC: 3.76 MIL/uL — ABNORMAL LOW (ref 4.22–5.81)
RDW: 12.3 % (ref 11.5–15.5)
WBC: 6.5 K/uL (ref 4.0–10.5)
nRBC: 0 % (ref 0.0–0.2)

## 2023-10-03 LAB — COMPREHENSIVE METABOLIC PANEL WITH GFR
ALT: 105 U/L — ABNORMAL HIGH (ref 0–44)
AST: 530 U/L — ABNORMAL HIGH (ref 15–41)
Albumin: 2.8 g/dL — ABNORMAL LOW (ref 3.5–5.0)
Alkaline Phosphatase: 36 U/L — ABNORMAL LOW (ref 38–126)
Anion gap: 8 (ref 5–15)
BUN: <5 mg/dL — ABNORMAL LOW (ref 8–23)
CO2: 24 mmol/L (ref 22–32)
Calcium: 8.4 mg/dL — ABNORMAL LOW (ref 8.9–10.3)
Chloride: 107 mmol/L (ref 98–111)
Creatinine, Ser: 0.82 mg/dL (ref 0.61–1.24)
GFR, Estimated: >60 mL/min (ref >60)
Glucose, Bld: 108 mg/dL — ABNORMAL HIGH (ref 70–99)
Potassium: 4.5 mmol/L (ref 3.5–5.1)
Sodium: 139 mmol/L (ref 135–145)
Total Bilirubin: 0.8 mg/dL (ref 0.0–1.2)
Total Protein: 5.4 g/dL — ABNORMAL LOW (ref 6.5–8.1)

## 2023-10-03 LAB — CK
Total CK: 9492 U/L — ABNORMAL HIGH (ref 49–397)
Total CK: 15206 U/L — ABNORMAL HIGH (ref 49–397)

## 2023-10-03 NOTE — Progress Notes (Signed)
 PROGRESS NOTE Eddie Harris  FMW:[id number] DOB: Jul 22, 1960 DOA: 09/27/2023 PCP: Lazoff, Shawn P, DO  Brief Narrative/Hospital Course: 63 year old with history of alcohol use, HTN presented to the hospital with inability to walk and mechanical fall at home causing left knee pain.  CT of the head showed bilateral subdural hematoma with 2 mm shift.  EDP discussed case with neurosurgery who recommended neurochecks and repeat CT head.  He reportedly drinks 3-4 times weekly.  Repeat scan overall was stable therefore neurosurgery recommended outpatient CT scan in 2 weeks. Is getting IV fluids for rhabdomyolysis, on alcohol withdrawal protocol and high-dose thiamine  x 3 days  Subjective: Seen and examined today Overnight afebrile blood pressure 120s-140s systolic, on room air CBC stable Ck has been >20k-this morning down to 9492, CMP shows AST ALT 530/105 improving TB normal Denies nausea vomiting and tremors.    Assessment and plan: Principal Problem:   Subdural hematoma (HCC) Active Problems:   Essential hypertension, benign   Alcohol use disorder   Fatty liver, alcoholic   Rhabdomyolysis   Ataxia   Mechanical fall with traumatic subdural hematoma: Repeat CT head stable per Dr. Darnella neurosurgery, repeat CT head in 2 weeks continue supportive care avoid anticoagulants or aspirin.  Monitor sodium and electrolytes  Ataxia Left knee pain Deconditioning debility: In the setting of fall.  Suspect in the setting of alcohol use having ataxia.  X-ray knee unremarkable Brain MRI showed stable bleed B12 folate TSH normal. S/p high-dose thiamine  completed 8/8 continue pain treatment, PT OT  Rhabdomyolysis Metabolic acidosis: CK remains persistently elevated patient is being aggressively hydrated w/ NS for UOP goal 200-250 cc/hr, renal function preserved.  CK downtrending further will cut down the IV fluids. Plan of care was discussed with nephrology, gi team previously,cont to trend ck and  RFT Recent Labs  Lab 09/27/23 2223 09/28/23 0620 09/29/23 0559 09/30/23 0602 09/30/23 1433 10/01/23 0552 10/01/23 1450 10/02/23 0703 10/02/23 1403 10/03/23 0640  CKTOTAL 1,006* 2,479* 10,561* >20,000* >20,000* >20,000* >20,000* >20,000* >20,000* 9,492*   Transaminitis Alcohol abuse At risk of withdrawal: Monitor closely for signs of withdrawal.  S/P Librium  taper and high-dose thiamine .  Continue multivitamins.  LFTs elevated but slowly downtrending, monitor LFTs Recent Labs  Lab 09/27/23 2233 09/27/23 2234 09/28/23 9379 09/29/23 0559 09/30/23 0602 09/30/23 1433 10/01/23 0552 10/01/23 1450 10/02/23 0703 10/03/23 0640  AST  --   --    < > 173* 808* 579* 647* 713* 677* 530*  ALT  --   --    < > 51* 112* 97* 102* 114* 108* 105*  ALKPHOS  --   --    < > 43 44 39 37* 42 35* 36*  BILITOT  --   --    < > 2.2* 1.8* 1.3* 0.9 0.9 0.9 0.8  PROT  --   --    < > 6.7 7.0 6.1* 5.8* 6.3* 5.6* 5.4*  ALBUMIN  --   --    < > 3.6 3.6 3.1* 2.9* 3.3* 2.8* 2.8*  AMMONIA  --  18  --   --   --   --   --   --   --   --   INR 0.9  --   --   --   --   --   --   --   --   --   PLT  --   --    < > 155 181  --  154  --  171 171   < > =  values in this interval not displayed.   Essential hypertension Blood pressure is controlled continue amlodipine  5, Toprol  100  Class I Obesity w/ Body mass index is 32.23 kg/m.: Will benefit with PCP follow-up, weight loss,healthy lifestyle and outpatient sleep eval if not done.  Mobility: PT Orders: Active PT Follow up Rec: Other (Comment) (Hhpt Or If Unable Will Give Resources For [facility])09/30/2023 1756   DVT prophylaxis: SCDs Start: 09/28/23 0226 Code Status:   Code Status: Full Code Family Communication: plan of care discussed with patient at bedside. Patient status is: Remains hospitalized because of severity of illness Level of care: Telemetry Medical   Dispo: The patient is from: home            Anticipated disposition: Home  tomorrow Objective: Vitals last 24 hrs: Vitals:   10/02/23 2113 10/02/23 2319 10/03/23 0300 10/03/23 0837  BP: (!) 145/99 (!) 144/95 128/89 (!) 149/66  Pulse: 88 75 64 67  Resp:  18 18   Temp:  98.1 F (36.7 C) 98 F (36.7 C)   TempSrc:  Oral Oral   SpO2:  97% 96%   Weight:      Height:        Physical Examination: General exam: alert awake, oriented, older than stated age HEENT:Oral mucosa moist, Ear/Nose WNL grossly Respiratory system: Bilaterally clear BS,no use of accessory muscle Cardiovascular system: S1 & S2 +, No JVD. Gastrointestinal system: Abdomen soft,NT,ND, BS+ Nervous System: Alert, awake, moving all extremities,and following commands. Extremities: LE edema neg, distal extremities warm.  Skin: No rashes,no icterus. MSK: Normal muscle bulk,tone, power   Medications reviewed:  Scheduled Meds:  amLODipine   5 mg Oral QHS   escitalopram   20 mg Oral QHS   folic acid   1 mg Oral Daily   LORazepam   1 mg Oral QHS   metoprolol  succinate  100 mg Oral Daily   multivitamin with minerals  1 tablet Oral Daily   thiamine   200 mg Oral Daily   Continuous Infusions:  sodium chloride  150 mL/hr at 10/03/23 9046   Diet: Diet Order             Diet regular Room service appropriate? Yes; Fluid consistency: Thin  Diet effective now                   Data Reviewed: I have personally reviewed following labs and imaging studies ( see epic result tab) CBC: Recent Labs  Lab 09/27/23 2020 09/28/23 0620 09/29/23 0559 09/30/23 0602 10/01/23 0552 10/02/23 0703 10/03/23 0640  WBC 12.1*   < > 8.2 9.4 7.2 6.9 6.5  NEUTROABS 8.1*  --   --   --   --   --   --   HGB 15.7   < > 14.6 15.0 13.3 13.2 13.0  HCT 45.9   < > 41.5 43.1 38.0* 37.3* 37.0*  MCV 98.1   < > 97.9 98.2 98.4 97.9 98.4  PLT 223   < > 155 181 154 171 171   < > = values in this interval not displayed.   CMP: Recent Labs  Lab 09/27/23 2223 09/28/23 0620 09/29/23 0559 09/30/23 0602 09/30/23 1433  10/01/23 0552 10/01/23 1450 10/02/23 0703 10/03/23 0640  NA  --  134* 136 139 138 138 139 141 139  K  --  3.5 3.9 4.3 3.9 3.4* 4.0 3.8 4.5  CL  --  102 100 98 104 107 103 109 107  CO2  --  18* 22  26 22 23 22 23 24   GLUCOSE  --  96 98 107* 102* 113* 121* 107* 108*  BUN  --  <5* 5* 8 6* 5* <5* <5* <5*  CREATININE  --  0.92 0.95 1.02 0.89 0.83 0.78 0.80 0.82  CALCIUM   --  8.8* 9.1 9.4 8.7* 8.4* 8.8* 8.3* 8.4*  MG 2.0 1.6* 1.9 1.9  --  1.6*  --  1.7  --   PHOS 2.3* 4.5  --   --   --   --   --   --   --    GFR: Estimated Creatinine Clearance: 108.3 mL/min (by C-G formula based on SCr of 0.82 mg/dL). Recent Labs  Lab 09/30/23 1433 10/01/23 0552 10/01/23 1450 10/02/23 0703 10/03/23 0640  AST 579* 647* 713* 677* 530*  ALT 97* 102* 114* 108* 105*  ALKPHOS 39 37* 42 35* 36*  BILITOT 1.3* 0.9 0.9 0.9 0.8  PROT 6.1* 5.8* 6.3* 5.6* 5.4*  ALBUMIN 3.1* 2.9* 3.3* 2.8* 2.8*   No results for input(s): LIPASE, AMYLASE in the last 168 hours.  Recent Labs  Lab 09/27/23 2234  AMMONIA 18   Coagulation Profile:  Recent Labs  Lab 09/27/23 2233  INR 0.9   Unresulted Labs (From admission, onward)     Start     Ordered   09/29/23 0500  CBC  Daily,   R     Question:  Specimen collection method  Answer:  Lab=Lab collect   09/28/23 0757           Antimicrobials/Microbiology: Anti-infectives (From admission, onward)    None      No results found for: SDES, SPECREQUEST, CULT, REPTSTATUS  Procedures:    Mennie LAMY, MD [facility] 10/03/2023, 10:14 AM

## 2023-10-03 NOTE — Plan of Care (Signed)

## 2023-10-03 NOTE — Progress Notes (Signed)
 Physical Therapy Treatment Patient Details Name: Eddie Harris MRN: [id number] DOB: 1960-07-15 Today's Date: 10/03/2023   History of Present Illness Pt is a 63 y.o. male presenting 8/5 after fall at home. Initial imaging negative, but fell again in ED and CTH with bil SDH along L anterior and lateral convexity measuring up to 15 mm thickness with mass effect on underlying left frontal lobe; trace right anterior convexity subdural hematoma measuring 4 mm. PMH: alcohol use, HTN, GERD, HLD, p-Afib.    PT Comments  Patient reports he has been walking to bathroom by himself. Performed elements of Dynamic Gait Index with pt's velocity decreased and otherwise no imbalance. Reported he felt too weak to incr his velocity. Berg Balance Assessment completed with pt scoring 43/56 (<45 demonstrates at incr risk for falls). Patient becomes tremulous with tasks requiring small or narrow base of support. Can benefit from continued PT, including OPPT on discharge.     If plan is discharge home, recommend the following: Assist for transportation;Assistance with cooking/housework   Can travel by private vehicle        Equipment Recommendations  None recommended by PT    Recommendations for Other Services       Precautions / Restrictions Precautions Precautions: Fall Restrictions Weight Bearing Restrictions Per Provider Order: No     Mobility  Bed Mobility Overal bed mobility: Modified Independent             General bed mobility comments: already sitting EOB upon PT entry    Transfers Overall transfer level: Needs assistance Equipment used: None Transfers: Sit to/from Stand Sit to Stand: Independent           General transfer comment: from EOB and toilet    Ambulation/Gait Ambulation/Gait assistance: Contact guard assist, Modified independent (Device/Increase time) Gait Distance (Feet): 200 Feet Assistive device: None Gait Pattern/deviations: Step-through pattern, Decreased  stride length   Gait velocity interpretation: 1.31 - 2.62 ft/sec, indicative of limited community ambulator   General Gait Details: initially holding IV pole; progressed to no device; states he is not feeling strong enough to walk his normal pace   Stairs             Wheelchair Mobility     Tilt Bed    Modified Rankin (Stroke Patients Only)       Balance Overall balance assessment: Needs assistance   Sitting balance-Leahy Scale: Good     Standing balance support: No upper extremity supported Standing balance-Leahy Scale: Fair   Single Leg Stance - Right Leg: 2 Single Leg Stance - Left Leg: 2     Rhomberg - Eyes Opened: 30 (incr sway) Rhomberg - Eyes Closed: 0 (feet apart, EC 10 sec with incr sway)     Standardized Balance Assessment Standardized Balance Assessment : Dynamic Gait Index, Berg Balance Test Berg Balance Test Sit to Stand: Able to stand  independently using hands Standing Unsupported: Able to stand safely 2 minutes Sitting with Back Unsupported but Feet Supported on Floor or Stool: Able to sit safely and securely 2 minutes Stand to Sit: Sits safely with minimal use of hands Transfers: Able to transfer safely, minor use of hands Standing Unsupported with Eyes Closed: Able to stand 10 seconds with supervision Standing Ubsupported with Feet Together: Able to place feet together independently and stand for 1 minute with supervision From Standing, Reach Forward with Outstretched Arm: Can reach confidently >25 cm (10) From Standing Position, Pick up Object from Floor: Able to pick up shoe, needs  supervision From Standing Position, Turn to Look Behind Over each Shoulder: Looks behind from both sides and weight shifts well Turn 360 Degrees: Needs close supervision or verbal cueing Standing Unsupported, Alternately Place Feet on Step/Stool: Able to stand independently and complete 8 steps >20 seconds Standing Unsupported, One Foot in Front: Able to take  small step independently and hold 30 seconds Standing on One Leg: Tries to lift leg/unable to hold 3 seconds but remains standing independently Total Score: 43 Dynamic Gait Index Level Surface: Mild Impairment Change in Gait Speed: Mild Impairment Gait with Horizontal Head Turns: Normal Gait with Vertical Head Turns: Normal Gait and Pivot Turn: Normal      Communication Communication Communication: No apparent difficulties  Cognition Arousal: Alert Behavior During Therapy: WFL for tasks assessed/performed   PT - Cognitive impairments: No apparent impairments                         Following commands: Intact      Cueing Cueing Techniques: Verbal cues  Exercises      General Comments General comments (skin integrity, edema, etc.): HR 85-96 with activity      Pertinent Vitals/Pain Pain Assessment Pain Assessment: Faces Faces Pain Scale: Hurts a little bit Pain Location: L knee Pain Descriptors / Indicators: Aching, Sore Pain Intervention(s): Limited activity within patient's tolerance, Monitored during session    Home Living                          Prior Function            PT Goals (current goals can now be found in the care plan section) Acute Rehab PT Goals Patient Stated Goal: return home Time For Goal Achievement: 10/12/23 Potential to Achieve Goals: Good Progress towards PT goals: Progressing toward goals    Frequency    Min 3X/week      PT Plan      Co-evaluation              AM-PAC PT 6 Clicks Mobility   Outcome Measure  Help needed turning from your back to your side while in a flat bed without using bedrails?: None Help needed moving from lying on your back to sitting on the side of a flat bed without using bedrails?: None Help needed moving to and from a bed to a chair (including a wheelchair)?: None Help needed standing up from a chair using your arms (e.g., wheelchair or bedside chair)?: None Help needed  to walk in hospital room?: A Little Help needed climbing 3-5 steps with a railing? : A Little 6 Click Score: 22    End of Session Equipment Utilized During Treatment: Gait belt Activity Tolerance: Patient tolerated treatment well Patient left: with call bell/phone within reach;in chair Nurse Communication: Mobility status;Other (comment) (pt wants to see her) PT Visit Diagnosis: Other abnormalities of gait and mobility (R26.89);Ataxic gait (R26.0);History of falling (Z91.81)     Time: 8665-8644 PT Time Calculation (min) (ACUTE ONLY): 21 min  Charges:    $Gait Training: 8-22 mins PT General Charges $$ ACUTE PT VISIT: 1 Visit                      Eddie Harris, PT Acute Rehabilitation Services  Office [phone]    Eddie Harris 10/03/2023, 2:52 PM

## 2023-10-04 ENCOUNTER — Other Ambulatory Visit (HOSPITAL_COMMUNITY): Payer: Self-pay

## 2023-10-04 LAB — COMPREHENSIVE METABOLIC PANEL WITH GFR
ALT: 94 U/L — ABNORMAL HIGH (ref 0–44)
AST: 351 U/L — ABNORMAL HIGH (ref 15–41)
Albumin: 2.8 g/dL — ABNORMAL LOW (ref 3.5–5.0)
Alkaline Phosphatase: 35 U/L — ABNORMAL LOW (ref 38–126)
Anion gap: 8 (ref 5–15)
BUN: <5 mg/dL — ABNORMAL LOW (ref 8–23)
CO2: 26 mmol/L (ref 22–32)
Calcium: 8.5 mg/dL — ABNORMAL LOW (ref 8.9–10.3)
Chloride: 104 mmol/L (ref 98–111)
Creatinine, Ser: 0.83 mg/dL (ref 0.61–1.24)
GFR, Estimated: >60 mL/min (ref >60)
Glucose, Bld: 111 mg/dL — ABNORMAL HIGH (ref 70–99)
Potassium: 3.9 mmol/L (ref 3.5–5.1)
Sodium: 138 mmol/L (ref 135–145)
Total Bilirubin: 0.8 mg/dL (ref 0.0–1.2)
Total Protein: 5.5 g/dL — ABNORMAL LOW (ref 6.5–8.1)

## 2023-10-04 LAB — CK: Total CK: 4113 U/L — ABNORMAL HIGH (ref 49–397)

## 2023-10-04 NOTE — Plan of Care (Signed)
   Problem: Education: Goal: Knowledge of General Education information will improve Description: Including pain rating scale, medication(s)/side effects and non-pharmacologic comfort measures Outcome: Progressing   Problem: Health Behavior/Discharge Planning: Goal: Ability to manage health-related needs will improve Outcome: Progressing   Problem: Clinical Measurements: Goal: Ability to maintain clinical measurements within normal limits will improve Outcome: Progressing Goal: Will remain free from infection Outcome: Progressing Goal: Diagnostic test results will improve Outcome: Progressing Goal: Respiratory complications will improve Outcome: Progressing Goal: Cardiovascular complication will be avoided Outcome: Progressing   Problem: Activity: Goal: Risk for activity intolerance will decrease Outcome: Progressing   Problem: Nutrition: Goal: Adequate nutrition will be maintained Outcome: Progressing   Problem: Coping: Goal: Level of anxiety will decrease Outcome: Progressing   Problem: Elimination: Goal: Will not experience complications related to bowel motility Outcome: Progressing Goal: Will not experience complications related to urinary retention Outcome: Progressing   Problem: Pain Managment: Goal: General experience of comfort will improve and/or be controlled Outcome: Progressing   Problem: Safety: Goal: Ability to remain free from injury will improve Outcome: Progressing   Problem: Skin Integrity: Goal: Risk for impaired skin integrity will decrease Outcome: Progressing   Problem: Education: Goal: Ability to state activities that reduce stress will improve Outcome: Progressing   Problem: Coping: Goal: Ability to identify and develop effective coping behavior will improve Outcome: Progressing   Problem: Self-Concept: Goal: Ability to identify factors that promote anxiety will improve Outcome: Progressing Goal: Level of anxiety will  decrease Outcome: Progressing Goal: Ability to modify response to factors that promote anxiety will improve Outcome: Progressing

## 2023-10-04 NOTE — Progress Notes (Signed)
 Occupational Therapy Treatment Patient Details Name: Eddie Harris MRN: [id number] DOB: Apr 26, 1960 Today's Date: 10/04/2023   History of present illness Pt is a 63 y.o. male presenting 8/5 after fall at home. Initial imaging negative, but fell again in ED and CTH with bil SDH along L anterior and lateral convexity measuring up to 15 mm thickness with mass effect on underlying left frontal lobe; trace right anterior convexity subdural hematoma measuring 4 mm. PMH: alcohol use, HTN, GERD, HLD, p-Afib.   OT comments  Pt progressing toward established OT goals. Passed Short Blessed Test of cognition this session with one error with delayed memory recall. Pt with max concerns about downhill slope to get to his apartment from parking lot today with plans for discharge and asking for RW; reached out to case management who was helpful. Pt needing up to supervision for safety during ADL; predominantly due to decr balance. Defer to OP PT to work on balance.       If plan is discharge home, recommend the following:  A little help with walking and/or transfers;A little help with bathing/dressing/bathroom;Assistance with cooking/housework;Assist for transportation;Help with stairs or ramp for entrance   Equipment Recommendations  Other (comment) (RW)    Recommendations for Other Services      Precautions / Restrictions Precautions Precautions: Fall Restrictions Weight Bearing Restrictions Per Provider Order: No       Mobility Bed Mobility Overal bed mobility: Modified Independent                  Transfers Overall transfer level: Needs assistance Equipment used: None Transfers: Sit to/from Stand Sit to Stand: Independent           General transfer comment: from EOB and toilet     Balance Overall balance assessment: Needs assistance   Sitting balance-Leahy Scale: Good Sitting balance - Comments: able to doff and don socks seated EOB   Standing balance support: No upper  extremity supported Standing balance-Leahy Scale: Fair                             ADL either performed or assessed with clinical judgement   ADL Overall ADL's : Needs assistance/impaired     Grooming: Supervision/safety;Standing               Lower Body Dressing: Supervision/safety   Toilet Transfer: Supervision/safety;Ambulation   Toileting- Clothing Manipulation and Hygiene: Modified independent;Sitting/lateral lean       Functional mobility during ADLs: Supervision/safety      Extremity/Trunk Assessment Upper Extremity Assessment Upper Extremity Assessment: Overall WFL for tasks assessed (tremor present but barely noticeable today)            Vision       Perception     Praxis     Communication Communication Communication: No apparent difficulties   Cognition Arousal: Alert Behavior During Therapy: WFL for tasks assessed/performed Cognition: No apparent impairments             OT - Cognition Comments: passed short blessed test with one error in delayed memory recall                 Following commands: Intact        Cueing   Cueing Techniques: Verbal cues  Exercises      Shoulder Instructions       General Comments pt with concerns about slope down to apartment when exiting vehicle later today as well  as navigating in and out of his home, asking for RW; advocated with case manager and able to get pt RW with Out of pocket pay to optimize safety with dc    Pertinent Vitals/ Pain       Pain Assessment Pain Assessment: Faces Faces Pain Scale: No hurt Pain Intervention(s): Monitored during session  Home Living                                          Prior Functioning/Environment              Frequency  Min 2X/week        Progress Toward Goals  OT Goals(current goals can now be found in the care plan section)  Progress towards OT goals: Progressing toward goals  Acute Rehab OT  Goals Patient Stated Goal: get better OT Goal Formulation: With patient Time For Goal Achievement: 10/12/23 Potential to Achieve Goals: Good ADL Goals Pt Will Perform Grooming: standing;with supervision Pt Will Perform Lower Body Dressing: with modified independence;sit to/from stand Pt Will Transfer to Toilet: with supervision;ambulating  Plan      Co-evaluation                 AM-PAC OT 6 Clicks Daily Activity     Outcome Measure   Help from another person eating meals?: None Help from another person taking care of personal grooming?: A Little Help from another person toileting, which includes using toliet, bedpan, or urinal?: A Little Help from another person bathing (including washing, rinsing, drying)?: A Little Help from another person to put on and taking off regular upper body clothing?: A Little Help from another person to put on and taking off regular lower body clothing?: A Little 6 Click Score: 19    End of Session    OT Visit Diagnosis: Unsteadiness on feet (R26.81);History of falling (Z91.81);Muscle weakness (generalized) (M62.81)   Activity Tolerance Patient tolerated treatment well   Patient Left in bed;with call bell/phone within reach;with bed alarm set   Nurse Communication Mobility status        Time: 1140-1158 OT Time Calculation (min): 18 min  Charges: OT General Charges $OT Visit: 1 Visit OT Treatments $Self Care/Home Management : 8-22 mins  Elma JONETTA Lebron FREDERICK, OTR/L [facility] Acute Rehabilitation Office: [phone]   Elma JONETTA Lebron 10/04/2023, 1:39 PM

## 2023-10-04 NOTE — Discharge Summary (Signed)
 Physician Discharge Summary  Eddie Harris FMW:[id number] DOB: 14-Feb-1961 DOA: 09/27/2023  PCP: Lazoff, Shawn P, DO  Admit date: 09/27/2023 Discharge date: 10/04/2023 Recommendations for Outpatient Follow-up:  Follow up with PCP in 1 weeks-call for appointment Please obtain BMP/CBC in one week Follow-up with neurosurgery FOR CTA of the head as instructed in 2 weeks from 09/28/23.  Discharge Dispo: Home Discharge Condition: Stable Code Status:   Code Status: Full Code Diet recommendation:  Diet Order             Diet regular Room service appropriate? Yes; Fluid consistency: Thin  Diet effective now                    Brief/Interim Summary: 63 year old with history of alcohol use, HTN presented to the hospital with inability to walk and mechanical fall at home causing left knee pain.  CT of the head showed bilateral subdural hematoma with 2 mm shift.  EDP discussed case with neurosurgery who recommended neurochecks and repeat CT head.  He reportedly drinks 3-4 times weekly.  Repeat scan overall was stable therefore neurosurgery recommended outpatient CT scan in 2 weeks. Is getting IV fluids for rhabdomyolysis, on alcohol withdrawal protocol and high-dose thiamine  x 3 days Patient appears neurologically intact.  He has been managed with IV fluid hydration aggressively for rhabdomyolysis which has improved to 4000 at this time he is adamant on going home we will discharge him home instructed to follow-up with PCP neurosurgery for repeat CT head in 2 weeks from 8/6 He has been ambulating well.  Continue PT as outpatient  Subjective: Seen and examined today Overnight afebrile BP stable Patient has been very eager to go home for few days now His CK has been fluctuating  Discharge diagnosis  Mechanical fall with traumatic subdural hematoma: Repeat CT head stable per Dr. Darnella neurosurgery, repeat CT head in 2 weeks continue supportive care avoid anticoagulants or aspirin. Monitor sodium  and electrolytes/.  Instructed to avoid any NSAID aspirin or blood thinner.  Again instructed that he needs repeat CT scan for nonrising provided to follow-up with neurosurgery.  Ataxia Left knee pain Deconditioning debility: In the setting of fall.  Suspect in the setting of alcohol use having ataxia.  X-ray knee unremarkable Brain MRI stable bleed, B12 folate TSH normal. S/p high-dose thiamine  completed 8/8 continue pain treatment, PT OT and mobilizing well  Rhabdomyolysis Metabolic acidosis: CK remained persistently elevated , being aggressively hydrated w/ IVF for UOP goal 200-250 cc/hr, renal function preserved. CK now downtrending.Plan of care was discussed with nephrology, gi team previously,cont to trend ck and RFT-renal function was stable CK has improved to 4000 now stable for discharge> Patient is adamant on going home again  Recent Labs  Lab 09/29/23 0559 09/30/23 0602 09/30/23 1433 10/01/23 0552 10/01/23 1450 10/02/23 0703 10/02/23 1403 10/03/23 0640 10/03/23 1622 10/04/23 0630  CKTOTAL 10,561* >20,000* >20,000* >20,000* >20,000* >20,000* >20,000* 0,507* 15,206* 4,113*   Transaminitis Alcohol abuse At risk of withdrawal: S/P Librium  taper and high-dose thiamine .Monitor closely for signs of withdrawal.  Continue multivitamins.   LFTs elevated but slowly downtrending, monitor LFTs as outpatient as well and avoid alcohol.  He needs follow-up lab checked in 1 week from PCP for LFTs Recent Labs  Lab 09/27/23 2233 09/27/23 2234 09/28/23 9379 09/29/23 0559 09/30/23 0602 09/30/23 1433 10/01/23 0552 10/01/23 1450 10/02/23 0703 10/03/23 0640 10/04/23 0630  AST  --   --    < > 173* 808*   < >  647* 713* 677* 530* 351*  ALT  --   --    < > 51* 112*   < > 102* 114* 108* 105* 94*  ALKPHOS  --   --    < > 43 44   < > 37* 42 35* 36* 35*  BILITOT  --   --    < > 2.2* 1.8*   < > 0.9 0.9 0.9 0.8 0.8  PROT  --   --    < > 6.7 7.0   < > 5.8* 6.3* 5.6* 5.4* 5.5*  ALBUMIN  --    --    < > 3.6 3.6   < > 2.9* 3.3* 2.8* 2.8* 2.8*  AMMONIA  --  18  --   --   --   --   --   --   --   --   --   INR 0.9  --   --   --   --   --   --   --   --   --   --   PLT  --   --    < > 155 181  --  154  --  171 171  --    < > = values in this interval not displayed.   Essential hypertension Blood pressure is controlled continue amlodipine  5, Toprol  100  Class I Obesity w/ Body mass index is 32.23 kg/m.: Will benefit with PCP follow-up, weight loss,healthy lifestyle and outpatient sleep eval if not done.  Mobility: PT Orders: Active PT Follow up Rec: Outpatient Pt (Oppt Or If Unable Will Give Resources For [facility])10/03/2023 1358   DVT prophylaxis: SCDs Start: 09/28/23 0226 Code Status:   Code Status: Full Code Family Communication: plan of care discussed with patient at bedside. Patient status is: Remains hospitalized because of severity of illness Level of care: Telemetry Medical   Dispo: The patient is from: home            Anticipated disposition: Home today or tomorrow  Objective: Vitals last 24 hrs: Vitals:   10/03/23 2115 10/03/23 2317 10/04/23 0305 10/04/23 0848  BP: (!) 141/104 (!) 137/113 (!) 131/96 (!) 129/94  Pulse: 68 71 70 73  Resp:  (!) 22 20 18   Temp:  98.4 F (36.9 C) 98.1 F (36.7 C) 98 F (36.7 C)  TempSrc:  Oral Oral Oral  SpO2:  95% 95% 96%  Weight:      Height:        Physical Examination: General exam: AAOX3 HEENT:Oral mucosa moist, Ear/Nose WNL grossly Respiratory system: Bilaterally clear BS,no use of accessory muscle Cardiovascular system: S1 & S2 +, No JVD. Gastrointestinal system: Abdomen soft,NT,ND, BS+ Nervous System: Alert, awake, moving all extremities,and following commands. Extremities: LE edema neg, distal extremities warm.  Skin: No rashes,no icterus. MSK: Normal muscle bulk,tone, power   Medications reviewed:  Scheduled Meds:  amLODipine   5 mg Oral QHS   escitalopram   20 mg Oral QHS   folic acid   1 mg  Oral Daily   LORazepam   1 mg Oral QHS   metoprolol  succinate  100 mg Oral Daily   multivitamin with minerals  1 tablet Oral Daily   thiamine   200 mg Oral Daily   Continuous Infusions:  sodium chloride  200 mL/hr at 10/04/23 0510   Diet: Diet Order             Diet regular Room service appropriate? Yes; Fluid consistency: Thin  Diet effective now                      Consultation: See note.  Discharge Instructions  Discharge Instructions     Discharge instructions   Complete by: As directed    Follow-up with neurosurgery office in 2 weeks time from 09/28/2023 to repeat CT scan of the head  Please call call MD or return to ER for similar or worsening recurring problem that brought you to hospital or if any fever,nausea/vomiting,abdominal pain, uncontrolled pain, chest pain,  shortness of breath or any other alarming symptoms.  Please follow-up your doctor as instructed in a week time and call the office for appointment.  Please avoid alcohol, smoking, or any other illicit substance and maintain healthy habits including taking your regular medications as prescribed.  You were cared for by a hospitalist during your hospital stay. If you have any questions about your discharge medications or the care you received while you were in the hospital after you are discharged, you can call the unit and ask to speak with the hospitalist on call if the hospitalist that took care of you is not available.  Once you are discharged, your primary care physician will handle any further medical issues. Please note that NO REFILLS for any discharge medications will be authorized once you are discharged, as it is imperative that you return to your primary care physician (or establish a relationship with a primary care physician if you do not have one) for your aftercare needs so that they can reassess your need for medications and monitor your lab values   Increase activity slowly   Complete by: As  directed       Allergies as of 10/04/2023       Reactions   Shellfish-derived Products Anaphylaxis, Nausea And Vomiting        Medication List     TAKE these medications    amLODipine  5 MG tablet Commonly known as: NORVASC  Take 5 mg by mouth at bedtime.   escitalopram  20 MG tablet Commonly known as: LEXAPRO  Take 20 mg by mouth at bedtime. What changed: Another medication with the same name was removed. Continue taking this medication, and follow the directions you see here.   MAGNESIUM  PO Take 1 tablet by mouth at bedtime.   metoprolol  succinate 100 MG 24 hr tablet Commonly known as: TOPROL -XL Take 100 mg by mouth at bedtime. Take with or immediately following a meal. What changed: Another medication with the same name was removed. Continue taking this medication, and follow the directions you see here.   OMEGA-3 FATTY ACIDS PO Take 2,400 g by mouth at bedtime.        Follow-up Information     Rubie Kemps, MD. Call in 1 day.   Specialty: Orthopedic Surgery Why: discuss your visit, see when they want to see you in the office Contact information: [address] [place] [address] [phone]         Darnella Dorn SAUNDERS, MD. Call in 2 week(s).   Specialty: Neurosurgery Why: Please call to arrange an outpatient non-contrast head CT in 2 weeks. Thank you Contact information: [address], [address] [place] [address] [phone]         Lazoff, Shawn P, DO Follow up in 1 week(s).   Specialty: Family Medicine Contact information: [address] [place] [address] [phone]         Lazoff, Shawn P, DO  Follow up in 1 week(s).   Specialty: Family Medicine Contact information: [address] [place] [address] [phone]                Allergies  Allergen Reactions   Shellfish-Derived Products Anaphylaxis and Nausea And Vomiting    The results of significant diagnostics from this hospitalization  (including imaging, microbiology, ancillary and laboratory) are listed below for reference.    Microbiology: No results found for this or any previous visit (from the past 240 hours).  Procedures/Studies: MR BRAIN WO CONTRAST Result Date: 09/28/2023 CLINICAL DATA:  Head trauma, ataxia EXAM: MRI HEAD WITHOUT CONTRAST TECHNIQUE: Multiplanar, multiecho pulse sequences of the brain and surrounding structures were obtained without intravenous contrast. COMPARISON:  CT September 28, 2023, May 25, 2022 FINDINGS: MRI brain: There is a subdural hematoma over the anterior left frontal convexity. The maximum thickness is about 1.7 cm. There is displacement of the left frontal lobe but no midline shift. The collection is isointense on T1 with high signal on T2 and FLAIR. The collection was hypointense on T1. There is a very small similar abnormality just to the right of midline. There is no significant signal abnormality in the brain parenchyma. There is no acute or chronic infarct. The ventricles are normal. No mass lesion. There are normal flow signals in the carotid arteries and basilar artery. No significant bone marrow signal abnormality. No significant abnormality in the paranasal sinuses or soft tissues. IMPRESSION: Subdural hematoma over the left anterior frontal convexity measures 1.7 cm in thickness. It is most likely subacute or chronic, but is new compared with the previous CT from May 25, 2022. Electronically Signed   By: Nancyann Burns M.D.   On: 09/28/2023 11:09   CT Head Wo Contrast Result Date: 09/28/2023 EXAM: CT HEAD WITHOUT CONTRAST 09/28/2023 02:02:32 AM TECHNIQUE: CT of the head was performed without the administration of intravenous contrast. Automated exposure control, iterative reconstruction, and/or weight based adjustment of the mA/kV was utilized to reduce the radiation dose to as low as reasonably achievable. COMPARISON: Comparison with prior studies dated 09/27/2018 and 12/17/2023. CLINICAL  HISTORY: Subdural hematoma. Evaluate SDH seen on earlier CT head, patient going to [facility] after CT. FINDINGS: BRAIN AND VENTRICLES: There is an unchanged epidural hematoma along the left anterior and lateral cerebral convexity, measuring 16 mm in radial dimension. Some of the hemorrhage crosses the midline into the anteromedial right frontal calvarium, measuring 4 mm. There is mild mass effect on the anterior left frontal lobe with no evidence of herniation. No evidence of acute infarct or new intracranial hemorrhage is seen. The ventricular caliber is stable, with a 2 mm rightward midline shift at the septum pellucidum. ORBITS: No acute abnormality. SINUSES: No acute abnormality. SOFT TISSUES AND SKULL: No acute soft tissue abnormality. No skull fracture. IMPRESSION: 1. Unchanged epidural hematoma along the left anterior and lateral cerebral convexity, measuring 16 mm in radial dimension, with some hemorrhage crossing the midline into the anteromedial right frontal calvarium, measuring 4 mm. 2. Mild mass effect on the anterior left frontal lobe with 2 mm of rightward midline shift at the septum pellucidum. No evidence of herniation. 3. No new intracranial hemorrhage or acute infarct. Electronically signed by: Norman Gatlin MD 09/28/2023 02:14 AM EDT RP Workstation: [id number]   DG CHEST PORT 1 VIEW Result Date: 09/27/2023 CLINICAL DATA:  Fall EXAM: PORTABLE CHEST 1 VIEW COMPARISON:  05/25/2022 FINDINGS: The heart size and mediastinal contours are within normal limits. Both lungs are  clear. The visualized skeletal structures are unremarkable. Hiatal hernia IMPRESSION: No active disease. Hiatal hernia. Electronically Signed   By: Luke Bun M.D.   On: 09/27/2023 22:47   CT Head Wo Contrast Result Date: 09/27/2023 CLINICAL DATA:  Head trauma abnormal mental status EXAM: CT HEAD WITHOUT CONTRAST TECHNIQUE: Contiguous axial images were obtained from the base of the skull through the vertex without intravenous  contrast. RADIATION DOSE REDUCTION: This exam was performed according to the departmental dose-optimization program which includes automated exposure control, adjustment of the mA and/or kV according to patient size and/or use of iterative reconstruction technique. COMPARISON:  CT brain 05/25/2022 FINDINGS: Brain: No acute territorial infarction or intracranial mass. Mixed density subdural hematoma along the left anterior and lateral convexity, this measures 15 mm maximum thickness on sagittal series 6, image 35. Small amounts of slightly dense more acute appearing blood within the subdural, series 2, image 23. Mass effect on the underlying left frontal lobe. There is trace right anterior convexity more isodense appearing subdural collection measuring 4 mm on series 2, image 20. Suspected trace amount of subdural blood along the right anterior falx measuring 2 mm in maximum thickness on series 2, image 12. Overall stable ventricle size. About 2 mm shift to the right anteriorly. Vascular: No hyperdense vessels.  Carotid vascular calcification Skull: No fracture Sinuses/Orbits: No acute finding. Other: None Traumatic Brain Injury Risk Stratification Skull Fracture: No - Low/mBIG 1 Subdural Hematoma (SDH): 8mm plus - High/mBIG 3 Subarachnoid Hemorrhage [facility]): No Epidural Hematoma (EDH): No - Low/mBIG 1 Cerebral contusion, intra-axial, intraparenchymal Hemorrhage (IPH): No Intraventricular Hemorrhage (IVH): No - Low/mBIG 1 Midline Shift > 1mm or Edema/effacement of sulci/vents: Yes - High/mBIG 3 ---------------------------------------------------- IMPRESSION: 1. Mixed density subdural hematoma along the left anterior and lateral convexity measuring up to 15 mm maximum thickness with mass effect on the underlying left frontal lobe. About 2 mm shift to the right anteriorly. 2. Trace right anterior convexity subdural hematoma measuring 4 mm. Suspected trace amount of subdural blood along the right anterior falx measuring 2  mm in maximum thickness. Critical Value/emergent results were called by telephone at the time of interpretation on 09/27/2023 at 9:04 pm to provider DAN FLOYD , who verbally acknowledged these results. Electronically Signed   By: Luke Bun M.D.   On: 09/27/2023 21:04   DG Knee Complete 4 Views Left Result Date: 09/27/2023 CLINICAL DATA:  Knee pain after fall. EXAM: LEFT KNEE - COMPLETE 4+ VIEW COMPARISON:  None Available. FINDINGS: No evidence of fracture, dislocation, or joint effusion. No evidence of arthropathy or other focal bone abnormality. Peripheral vascular calcifications. IMPRESSION: 1. No fracture or dislocation of the left knee. 2. Peripheral vascular disease. Electronically Signed   By: Andrea Gasman M.D.   On: 09/27/2023 18:20    Labs: BNP (last 3 results) No results for input(s): BNP in the last 8760 hours. Basic Metabolic Panel: Recent Labs  Lab 09/27/23 2223 09/28/23 0620 09/29/23 0559 09/30/23 0602 09/30/23 1433 10/01/23 0552 10/01/23 1450 10/02/23 0703 10/03/23 0640 10/04/23 0630  NA  --  134* 136 139   < > 138 139 141 139 138  K  --  3.5 3.9 4.3   < > 3.4* 4.0 3.8 4.5 3.9  CL  --  102 100 98   < > 107 103 109 107 104  CO2  --  18* 22 26   < > 23 22 23 24 26   GLUCOSE  --  96 98 107*   < >  113* 121* 107* 108* 111*  BUN  --  <5* 5* 8   < > 5* <5* <5* <5* <5*  CREATININE  --  0.92 0.95 1.02   < > 0.83 0.78 0.80 0.82 0.83  CALCIUM   --  8.8* 9.1 9.4   < > 8.4* 8.8* 8.3* 8.4* 8.5*  MG 2.0 1.6* 1.9 1.9  --  1.6*  --  1.7  --   --   PHOS 2.3* 4.5  --   --   --   --   --   --   --   --    < > = values in this interval not displayed.   Liver Function Tests: Recent Labs  Lab 10/01/23 0552 10/01/23 1450 10/02/23 0703 10/03/23 0640 10/04/23 0630  AST 647* 713* 677* 530* 351*  ALT 102* 114* 108* 105* 94*  ALKPHOS 37* 42 35* 36* 35*  BILITOT 0.9 0.9 0.9 0.8 0.8  PROT 5.8* 6.3* 5.6* 5.4* 5.5*  ALBUMIN 2.9* 3.3* 2.8* 2.8* 2.8*   No results for input(s):  LIPASE, AMYLASE in the last 168 hours. Recent Labs  Lab 09/27/23 2234  AMMONIA 18   CBC: Recent Labs  Lab 09/27/23 2020 09/28/23 0620 09/29/23 0559 09/30/23 0602 10/01/23 0552 10/02/23 0703 10/03/23 0640  WBC 12.1*   < > 8.2 9.4 7.2 6.9 6.5  NEUTROABS 8.1*  --   --   --   --   --   --   HGB 15.7   < > 14.6 15.0 13.3 13.2 13.0  HCT 45.9   < > 41.5 43.1 38.0* 37.3* 37.0*  MCV 98.1   < > 97.9 98.2 98.4 97.9 98.4  PLT 223   < > 155 181 154 171 171   < > = values in this interval not displayed.   CBG: No results for input(s): GLUCAP in the last 168 hours. Cardiac Enzymes: Recent Labs  Lab 10/02/23 0703 10/02/23 1403 10/03/23 0640 10/03/23 1622 10/04/23 0630  CKTOTAL >20,000* >20,000* 9,492* 15,206* 4,113*  Urinalysis    Component Value Date/Time   COLORURINE STRAW (A) 10/01/2023 1022   APPEARANCEUR CLEAR 10/01/2023 1022   LABSPEC 1.004 (L) 10/01/2023 1022   LABSPEC 1.005 12/01/2017 1443   PHURINE 6.0 10/01/2023 1022   GLUCOSEU NEGATIVE 10/01/2023 1022   HGBUR LARGE (A) 10/01/2023 1022   BILIRUBINUR NEGATIVE 10/01/2023 1022   BILIRUBINUR negative 12/01/2017 1443   BILIRUBINUR neg 04/26/2013 1127   KETONESUR NEGATIVE 10/01/2023 1022   PROTEINUR NEGATIVE 10/01/2023 1022   UROBILINOGEN negative 04/26/2013 1127   NITRITE NEGATIVE 10/01/2023 1022   LEUKOCYTESUR NEGATIVE 10/01/2023 1022   Sepsis Labs Recent Labs  Lab 09/30/23 0602 10/01/23 0552 10/02/23 0703 10/03/23 0640  WBC 9.4 7.2 6.9 6.5   Microbiology No results found for this or any previous visit (from the past 240 hours).   Time coordinating discharge: 35 minutes  SIGNED: Mennie LAMY, MD  [facility] 10/04/2023, 10:51 AM  If 7PM-7AM, please contact night-coverage [email or web address]

## 2023-10-04 NOTE — TOC Transition Note (Signed)
 Transition of Care [facility]) - Discharge Note Rayfield Gobble RN,BSN Inpatient Care Management Unit 4NP (Non Trauma)- RN Case Manager See Treatment Team for direct Phone #   Patient Details  Name: Eddie Harris MRN: [id number] Date of Birth: June 15, 1960  [facility] of [facility]) CM/SW Contact:  Gobble Rayfield Hurst, RN Phone Number: 10/04/2023, 12:42 PM   Clinical Narrative:    Pt stable for transition home today. Per OT DME recs for RW.  HH no longer recommended- changed to outpt.   CM spoke with pt at bedside- discussed no insurance coverage at this time- pt confirmed he has missed a payment and needs to get his insurance straightened out.  Discussed outpt therapy recommendations- pt voiced that he would rather wait on any referral and f/u with his PCP at [facility] after he gets his insurance active again. Pt states he does not have the funds to pay out of pocket for therapy.  Also discussed DME-RW need- pt expressed he would feel safer having RW to reduce the risk of falls. Expressed again that his money is tight and he is unable to afford out of pocket cost.  CM has reached out to [facility] supervisor- DOROTHA Jes- approval given for LOG to cover cost of RW on discharge.  Msg sent to MD for DME-RW order.   Call made to [employer] liaison for DME- RW need- [employer] to deliver RW to pt prior to discharge.   Per pt he will either call someone to assist him in getting home or call for a Lyft ride.   No further CM intervention needs noted.    Final next level of care: Home/Self Care Barriers to Discharge: Barriers Resolved   Patient Goals and CMS Choice Patient states their goals for this hospitalization and ongoing recovery are:: return home CMS Medicare.gov Compare Post Acute Care list provided to:: Patient Choice offered to / list presented to : Patient      Discharge Placement               Home        Discharge Plan and Services Additional resources added to the After Visit Summary for      Discharge Planning Services: CM Consult Post Acute Care Choice: Home Health, Durable Medical Equipment          DME Arranged: Walker rolling DME Agency: [employer] Date DME Agency Contacted: 10/04/23 Time DME Agency Contacted: 1242 Representative spoke with at DME Agency: Zack HH Arranged: Patient Refused HH HH Agency: NA        Social Drivers of Health (SDOH) Interventions SDOH Screenings   Food Insecurity: No Food Insecurity (09/28/2023)  Housing: Low Risk  (09/28/2023)  Transportation Needs: No Transportation Needs (09/28/2023)  Utilities: Not At Risk (09/28/2023)  Social Connections: Unknown (11/02/2022)   Received from [facility]  Tobacco Use: Medium Risk (09/27/2023)     Readmission Risk Interventions    09/29/2023    4:37 PM  Readmission Risk Prevention Plan  Post Dischage Appt Complete  Medication Screening Complete  Transportation Screening Complete
